# Patient Record
Sex: Female | Born: 1940 | Race: Black or African American | Hispanic: No | Marital: Married | Smoking: Former smoker
Health system: Southern US, Community
[De-identification: ages and names within clinical notes are randomized; demographics above are authoritative.]

## PROBLEM LIST (undated history)

## (undated) DIAGNOSIS — I10 Essential (primary) hypertension: Secondary | ICD-10-CM

## (undated) DIAGNOSIS — M109 Gout, unspecified: Secondary | ICD-10-CM

## (undated) HISTORY — PX: ABDOMINAL HYSTERECTOMY: SHX81

---

## 2004-04-23 ENCOUNTER — Ambulatory Visit: Payer: Self-pay | Admitting: Family Medicine

## 2005-10-22 ENCOUNTER — Ambulatory Visit: Payer: Self-pay | Admitting: *Deleted

## 2006-11-12 ENCOUNTER — Ambulatory Visit: Payer: Self-pay | Admitting: Family Medicine

## 2006-11-13 ENCOUNTER — Ambulatory Visit: Payer: Self-pay | Admitting: Family Medicine

## 2006-12-16 ENCOUNTER — Ambulatory Visit: Payer: Self-pay | Admitting: Urology

## 2007-09-23 ENCOUNTER — Ambulatory Visit: Payer: Self-pay | Admitting: Family Medicine

## 2007-12-28 ENCOUNTER — Ambulatory Visit: Payer: Self-pay | Admitting: Family Medicine

## 2008-10-11 ENCOUNTER — Ambulatory Visit: Payer: Self-pay | Admitting: Urology

## 2009-02-01 ENCOUNTER — Ambulatory Visit: Payer: Self-pay | Admitting: Family Medicine

## 2010-04-05 ENCOUNTER — Ambulatory Visit: Payer: Self-pay | Admitting: Family Medicine

## 2010-06-11 ENCOUNTER — Ambulatory Visit: Payer: Self-pay | Admitting: Family Medicine

## 2011-05-30 ENCOUNTER — Ambulatory Visit: Payer: Self-pay | Admitting: Family Medicine

## 2012-07-16 ENCOUNTER — Ambulatory Visit: Payer: Self-pay | Admitting: Family Medicine

## 2013-06-24 ENCOUNTER — Ambulatory Visit: Payer: Self-pay | Admitting: Unknown Physician Specialty

## 2013-06-25 LAB — PATHOLOGY REPORT

## 2013-08-09 ENCOUNTER — Ambulatory Visit: Payer: Self-pay | Admitting: Family Medicine

## 2013-08-12 ENCOUNTER — Ambulatory Visit: Payer: Self-pay | Admitting: Family Medicine

## 2013-12-08 ENCOUNTER — Emergency Department: Payer: Self-pay | Admitting: Emergency Medicine

## 2014-05-31 ENCOUNTER — Ambulatory Visit
Admit: 2014-05-31 | Disposition: A | Discharge status: Home / Self Care | Payer: Self-pay | Attending: Urology | Admitting: Urology

## 2014-06-23 ENCOUNTER — Ambulatory Visit
Admit: 2014-06-23 | Disposition: A | Discharge status: Home / Self Care | Payer: Self-pay | Attending: Urology | Admitting: Urology

## 2016-03-31 ENCOUNTER — Encounter: Payer: Self-pay | Admitting: Emergency Medicine

## 2016-03-31 ENCOUNTER — Emergency Department
Admission: EM | Admit: 2016-03-31 | Discharge: 2016-03-31 | Disposition: A | Discharge status: Home / Self Care | Payer: Medicare Other | Attending: Emergency Medicine | Admitting: Emergency Medicine

## 2016-03-31 ENCOUNTER — Emergency Department: Payer: Medicare Other

## 2016-03-31 DIAGNOSIS — Z87891 Personal history of nicotine dependence: Secondary | ICD-10-CM | POA: Diagnosis not present

## 2016-03-31 DIAGNOSIS — R05 Cough: Secondary | ICD-10-CM | POA: Diagnosis present

## 2016-03-31 DIAGNOSIS — I10 Essential (primary) hypertension: Secondary | ICD-10-CM | POA: Insufficient documentation

## 2016-03-31 DIAGNOSIS — J40 Bronchitis, not specified as acute or chronic: Secondary | ICD-10-CM | POA: Diagnosis not present

## 2016-03-31 HISTORY — DX: Essential (primary) hypertension: I10

## 2016-03-31 LAB — CBC
HEMATOCRIT: 40.4 % (ref 35.0–47.0)
Hemoglobin: 13.7 g/dL (ref 12.0–16.0)
MCH: 30.4 pg (ref 26.0–34.0)
MCHC: 33.8 g/dL (ref 32.0–36.0)
MCV: 90.1 fL (ref 80.0–100.0)
Platelets: 200 10*3/uL (ref 150–440)
RBC: 4.49 MIL/uL (ref 3.80–5.20)
RDW: 14.8 % — AB (ref 11.5–14.5)
WBC: 5.3 10*3/uL (ref 3.6–11.0)

## 2016-03-31 LAB — COMPREHENSIVE METABOLIC PANEL
ALBUMIN: 3.6 g/dL (ref 3.5–5.0)
ALK PHOS: 69 U/L (ref 38–126)
ALT: 27 U/L (ref 14–54)
AST: 28 U/L (ref 15–41)
Anion gap: 9 (ref 5–15)
BILIRUBIN TOTAL: 0.8 mg/dL (ref 0.3–1.2)
BUN: 13 mg/dL (ref 6–20)
CO2: 30 mmol/L (ref 22–32)
Calcium: 9.2 mg/dL (ref 8.9–10.3)
Chloride: 98 mmol/L — ABNORMAL LOW (ref 101–111)
Creatinine, Ser: 1.02 mg/dL — ABNORMAL HIGH (ref 0.44–1.00)
GFR calc Af Amer: >60 mL/min (ref >60)
GFR calc non Af Amer: 53 mL/min — ABNORMAL LOW (ref >60)
GLUCOSE: 114 mg/dL — AB (ref 65–99)
POTASSIUM: 3.1 mmol/L — AB (ref 3.5–5.1)
Sodium: 137 mmol/L (ref 135–145)
TOTAL PROTEIN: 7.7 g/dL (ref 6.5–8.1)

## 2016-03-31 LAB — TROPONIN I: Troponin I: <0.03 ng/mL (ref <0.03)

## 2016-03-31 MED ORDER — AZITHROMYCIN 500 MG PO TABS
500.0000 mg | ORAL_TABLET | Freq: Once | ORAL | Status: AC
Start: 2016-03-31 — End: 2016-03-31
  Administered 2016-03-31: 500 mg via ORAL
  Filled 2016-03-31: qty 1

## 2016-03-31 MED ORDER — PREDNISONE 20 MG PO TABS
60.0000 mg | ORAL_TABLET | Freq: Once | ORAL | Status: AC
  Administered 2016-03-31: 60 mg via ORAL
  Filled 2016-03-31: qty 3

## 2016-03-31 MED ORDER — PREDNISONE 50 MG PO TABS: ORAL_TABLET | ORAL | 0 refills | Status: DC

## 2016-03-31 MED ORDER — IPRATROPIUM-ALBUTEROL 0.5-2.5 (3) MG/3ML IN SOLN
9.0000 mL | Freq: Once | RESPIRATORY_TRACT | Status: AC
  Administered 2016-03-31: 9 mL via RESPIRATORY_TRACT
  Filled 2016-03-31: qty 9

## 2016-03-31 MED ORDER — AZITHROMYCIN 250 MG PO TABS: ORAL_TABLET | ORAL | 0 refills | Status: AC

## 2016-03-31 MED ORDER — ALBUTEROL SULFATE HFA 108 (90 BASE) MCG/ACT IN AERS: 2.0000 | INHALATION_SPRAY | Freq: Four times a day (QID) | RESPIRATORY_TRACT | 0 refills | Status: DC | PRN

## 2016-03-31 NOTE — ED Triage Notes (Signed)
Pt reports productive cough since Wednesday. C/o of yellow or blood tinged sputum. C/o chest pain made worse with cough. Denies fever or body aches.

## 2016-03-31 NOTE — ED Provider Notes (Signed)
[facility] Emergency Department Provider Note  ____________________________________________   First MD Initiated Contact with Patient 03/31/16 [phone]     (approximate)  I have reviewed the triage vital signs and the nursing notes.   HISTORY  Chief Complaint Chest Pain and Cough   HPI Erin Goodman is a 76 y.o. female with a history of hypertension who is presenting to the emergency department today with 3 days of cough and wheezing. She says that she also has some mild chest pain but only when she coughs. Says the chest pain is midsternal. Denies any nausea or vomiting. Denies any fever or body aches. Has a remote smoking history. Has not wheezed in the past. No history of COPD.   Past Medical History:  Diagnosis Date  . Hypertension     There are no active problems to display for this patient.   Past Surgical History:  Procedure Laterality Date  . ABDOMINAL HYSTERECTOMY      Prior to Admission medications   Not on File    Allergies Shellfish allergy  No family history on file.  Social History Social History  Substance Use Topics  . Smoking status: Former Games developermoker  . Smokeless tobacco: Never Used  . Alcohol use Yes     Comment: ocassionaly     Review of Systems Constitutional: No fever/chills Eyes: No visual changes. ENT: No sore throat. Cardiovascular: as above Respiratory: as above. Gastrointestinal: No abdominal pain.  No nausea, no vomiting.  No diarrhea.  No constipation. Genitourinary: Negative for dysuria. Musculoskeletal: Negative for back pain. Skin: Negative for rash. Neurological: Negative for headaches, focal weakness or numbness.  10-point ROS otherwise negative.  ____________________________________________   PHYSICAL EXAM:  VITAL SIGNS: ED Triage Vitals  Enc Vitals Group     BP 03/31/16 0742 (!) 155/66     Pulse Rate 03/31/16 0742 80     Resp 03/31/16 0742 20     Temp 03/31/16 0742 98.5 F (36.9 C)    Temp Source 03/31/16 0742 Oral     SpO2 03/31/16 0742 95 %     Weight 03/31/16 0744 250 lb (113.4 kg)     Height 03/31/16 0744 5\' 5"  (1.651 m)     Head Circumference --      Peak Flow --      Pain Score --      Pain Loc --      Pain Edu? --      Excl. in GC? --     Constitutional: Alert and oriented. Well appearing and in no acute distress. Eyes: Conjunctivae are normal. PERRL. EOMI. Head: Atraumatic. Nose: No congestion/rhinnorhea. Mouth/Throat: Mucous membranes are moist.   Neck: No stridor.   Cardiovascular: Normal rate, regular rhythm. Grossly normal heart sounds.  Respiratory: Normal respiratory effort.  No retractions. Mildly prolonged expiratory phase with minimal wheezing throughout both fields. Gastrointestinal: Soft and nontender. No distention. Musculoskeletal: Moderate edema to the bilateral lower extremities but the patient says that this is chronic and unchanged. Neurologic:  Normal speech and language. No gross focal neurologic deficits are appreciated.  Skin:  Skin is warm, dry and intact. No rash noted. Psychiatric: Mood and affect are normal. Speech and behavior are normal.  ____________________________________________   LABS (all labs ordered are listed, but only abnormal results are displayed)  Labs Reviewed  COMPREHENSIVE METABOLIC PANEL - Abnormal; Notable for the following:       Result Value   Potassium 3.1 (*)    Chloride 98 (*)  Glucose, Bld 114 (*)    Creatinine, Ser 1.02 (*)    GFR calc non Af Amer 53 (*)    All other components within normal limits  CBC - Abnormal; Notable for the following:    RDW 14.8 (*)    All other components within normal limits  TROPONIN I   ____________________________________________  EKG  ED ECG REPORT I, Marcellous Snarski,  Teena Irani, the attending physician, personally viewed and interpreted this ECG.   Date: 03/31/2016  EKG Time: 0757  Rate: 79  Rhythm: normal sinus rhythm  Axis: Normal  Intervals:  Normal   ST&T Change: No ST segment elevation or depression. No abnormal T-wave inversion.  ____________________________________________  RADIOLOGY  DG Chest 2 View (Final result)  Result time 03/31/16 08:50:43  Final result by Princella Pellegrini, MD (03/31/16 08:50:43)           Narrative:   CLINICAL DATA: 76 year old female with productive cough for 4 days and chest pain. Initial encounter.  EXAM: CHEST 2 VIEW  COMPARISON: 12/08/2013 and earlier.  FINDINGS: Stable lung volumes. Stable tortuosity of the thoracic aorta. Mild Calcified aortic atherosclerosis. Other mediastinal contours are within normal limits. Visualized tracheal air column is within normal limits. No pneumothorax, pulmonary edema, pleural effusion or confluent pulmonary opacity. No acute osseous abnormality identified.  IMPRESSION: No acute cardiopulmonary abnormality.  Calcified aortic atherosclerosis.   Electronically Signed By: Odessa Fleming M.D. On: 03/31/2016 08:50            ____________________________________________   PROCEDURES  Procedure(s) performed:   Procedures  Critical Care performed:   ____________________________________________   INITIAL IMPRESSION / ASSESSMENT AND PLAN / ED COURSE  Pertinent labs & imaging results that were available during my care of the patient were reviewed by me and considered in my medical decision making (see chart for details).  ----------------------------------------- 12:23 PM on 03/31/2016 -----------------------------------------  Reassessed the patient's breathing and she is now clear to auscultation throughout. She says that she feels subjectively better as well. Will be discharged with prednisone as well as azithromycin and an inhaler. She is understanding of the plan and willing to comply. Will be following up with [facility] clinic.      ____________________________________________   FINAL CLINICAL IMPRESSION(S) / ED  DIAGNOSES  Bronchitis.     NEW MEDICATIONS STARTED DURING THIS VISIT:  New Prescriptions   No medications on file     Note:  This document was prepared using Dragon voice recognition software and may include unintentional dictation errors.    Myrna Blazer, MD 03/31/16 1224

## 2016-07-24 ENCOUNTER — Other Ambulatory Visit: Payer: Self-pay | Admitting: Nurse Practitioner

## 2016-07-24 DIAGNOSIS — Z1231 Encounter for screening mammogram for malignant neoplasm of breast: Secondary | ICD-10-CM

## 2016-07-31 ENCOUNTER — Other Ambulatory Visit: Payer: Self-pay

## 2016-08-23 ENCOUNTER — Encounter: Payer: Self-pay | Admitting: Emergency Medicine

## 2016-08-23 ENCOUNTER — Emergency Department: Payer: Medicare Other

## 2016-08-23 ENCOUNTER — Emergency Department
Admission: EM | Admit: 2016-08-23 | Discharge: 2016-08-23 | Disposition: A | Discharge status: Home / Self Care | Payer: Medicare Other | Attending: Emergency Medicine | Admitting: Emergency Medicine

## 2016-08-23 DIAGNOSIS — Z7951 Long term (current) use of inhaled steroids: Secondary | ICD-10-CM | POA: Insufficient documentation

## 2016-08-23 DIAGNOSIS — Z79899 Other long term (current) drug therapy: Secondary | ICD-10-CM | POA: Insufficient documentation

## 2016-08-23 DIAGNOSIS — M7989 Other specified soft tissue disorders: Secondary | ICD-10-CM

## 2016-08-23 DIAGNOSIS — I10 Essential (primary) hypertension: Secondary | ICD-10-CM | POA: Diagnosis not present

## 2016-08-23 DIAGNOSIS — R2242 Localized swelling, mass and lump, left lower limb: Secondary | ICD-10-CM | POA: Diagnosis present

## 2016-08-23 DIAGNOSIS — Z87891 Personal history of nicotine dependence: Secondary | ICD-10-CM | POA: Insufficient documentation

## 2016-08-23 DIAGNOSIS — R609 Edema, unspecified: Secondary | ICD-10-CM

## 2016-08-23 LAB — CBC WITH DIFFERENTIAL/PLATELET
Basophils Absolute: 0 10*3/uL (ref 0–0.1)
Basophils Relative: 1 %
EOS ABS: 0.2 10*3/uL (ref 0–0.7)
Eosinophils Relative: 3 %
HEMATOCRIT: 42.5 % (ref 35.0–47.0)
HEMOGLOBIN: 14.6 g/dL (ref 12.0–16.0)
LYMPHS ABS: 2.9 10*3/uL (ref 1.0–3.6)
LYMPHS PCT: 52 %
MCH: 30.7 pg (ref 26.0–34.0)
MCHC: 34.3 g/dL (ref 32.0–36.0)
MCV: 89.5 fL (ref 80.0–100.0)
MONOS PCT: 10 %
Monocytes Absolute: 0.5 10*3/uL (ref 0.2–0.9)
NEUTROS PCT: 34 %
Neutro Abs: 1.9 10*3/uL (ref 1.4–6.5)
Platelets: 215 10*3/uL (ref 150–440)
RBC: 4.75 MIL/uL (ref 3.80–5.20)
RDW: 15 % — ABNORMAL HIGH (ref 11.5–14.5)
WBC: 5.6 10*3/uL (ref 3.6–11.0)

## 2016-08-23 LAB — COMPREHENSIVE METABOLIC PANEL
ALK PHOS: 67 U/L (ref 38–126)
ALT: 21 U/L (ref 14–54)
ANION GAP: 9 (ref 5–15)
AST: 25 U/L (ref 15–41)
Albumin: 3.9 g/dL (ref 3.5–5.0)
BILIRUBIN TOTAL: 0.7 mg/dL (ref 0.3–1.2)
BUN: 14 mg/dL (ref 6–20)
CALCIUM: 9.5 mg/dL (ref 8.9–10.3)
CO2: 29 mmol/L (ref 22–32)
CREATININE: 1.08 mg/dL — AB (ref 0.44–1.00)
Chloride: 99 mmol/L — ABNORMAL LOW (ref 101–111)
GFR, EST AFRICAN AMERICAN: 57 mL/min — AB (ref >60)
GFR, EST NON AFRICAN AMERICAN: 49 mL/min — AB (ref >60)
Glucose, Bld: 96 mg/dL (ref 65–99)
Potassium: 3.5 mmol/L (ref 3.5–5.1)
SODIUM: 137 mmol/L (ref 135–145)
TOTAL PROTEIN: 7.8 g/dL (ref 6.5–8.1)

## 2016-08-23 LAB — BRAIN NATRIURETIC PEPTIDE: B Natriuretic Peptide: 11 pg/mL (ref 0.0–100.0)

## 2016-08-23 NOTE — ED Notes (Signed)
Pt family denies needs at this time while patient in [place].

## 2016-08-23 NOTE — ED Notes (Signed)
Patient transported to Ultrasound 

## 2016-08-23 NOTE — ED Provider Notes (Signed)
[facility] Emergency Department Provider Note   ____________________________________________   I have reviewed the triage vital signs and the nursing notes.   HISTORY  Chief Complaint Leg Pain   History limited by: Not Limited   HPI Erin Goodman is a 76 y.o. female who presents to the emergency department today because of concerns for leg swelling. She states her left leg is worse. It has been swollen for months. The swelling will sometimes come and go in terms of its severity. The patient states she saw her primary care doctor but did not get an answer for why her legs are swollen. The patient does have associated pain. Is located in the posterior thigh. She has had some right leg swelling but it has not been as severe. The patient's concern that she might have a blood clot in that leg.   Past Medical History:  Diagnosis Date  . Hypertension     There are no active problems to display for this patient.   Past Surgical History:  Procedure Laterality Date  . ABDOMINAL HYSTERECTOMY      Prior to Admission medications   Medication Sig Start Date End Date Taking? Authorizing Provider  albuterol (PROVENTIL HFA;VENTOLIN HFA) 108 (90 Base) MCG/ACT inhaler Inhale 2 puffs into the lungs every 6 (six) hours as needed. 03/31/16   Myrna Blazer, MD  predniSONE (DELTASONE) 50 MG tablet 1 tab PO daily 03/31/16   Pershing Proud Myra Rude, MD    Allergies Penicillins and Shellfish allergy  No family history on file.  Social History Social History  Substance Use Topics  . Smoking status: Former Games developer  . Smokeless tobacco: Never Used  . Alcohol use Yes     Comment: ocassionaly     Review of Systems Constitutional: No fever/chills Eyes: No visual changes. ENT: No sore throat. Cardiovascular: Denies chest pain. Respiratory: Denies shortness of breath. Gastrointestinal: No abdominal pain.  No nausea, no vomiting.  No diarrhea.   Genitourinary:  Negative for dysuria. Musculoskeletal: Positive for leg swelling. Skin: Negative for rash. Neurological: Negative for headaches, focal weakness or numbness.  ____________________________________________   PHYSICAL EXAM:  VITAL SIGNS: ED Triage Vitals [08/23/16 1003]  Enc Vitals Group     BP 140/81     Pulse Rate 89     Resp 16     Temp 99.1 F (37.3 C)     Temp Source Oral     SpO2 97 %     Weight 250 lb (113.4 kg)     Height      Head Circumference      Peak Flow      Pain Score 6   Constitutional: Alert and oriented. Well appearing and in no distress. Eyes: Conjunctivae are normal.  ENT   Head: Normocephalic and atraumatic.   Nose: No congestion/rhinnorhea.   Mouth/Throat: Mucous membranes are moist.   Neck: No stridor. Hematological/Lymphatic/Immunilogical: No cervical lymphadenopathy. Cardiovascular: Normal rate, regular rhythm.  Systolic murmur.  Respiratory: Normal respiratory effort without tachypnea nor retractions. Breath sounds are clear and equal bilaterally. No wheezes/rales/rhonchi. Gastrointestinal: Soft and non tender. No rebound. No guarding.  Genitourinary: Deferred Musculoskeletal: Normal range of motion in all extremities. Bilateral lower extremity swelling, left greater than right. Neurologic:  Normal speech and language. No gross focal neurologic deficits are appreciated.  Skin:  Skin is warm, dry and intact. No rash noted. Psychiatric: Mood and affect are normal. Speech and behavior are normal. Patient exhibits appropriate insight and judgment.  ____________________________________________  LABS (pertinent positives/negatives)  Labs Reviewed  CBC WITH DIFFERENTIAL/PLATELET - Abnormal; Notable for the following:       Result Value   RDW 15.0 (*)    All other components within normal limits  COMPREHENSIVE METABOLIC PANEL - Abnormal; Notable for the following:    Chloride 99 (*)    Creatinine, Ser 1.08 (*)    GFR calc non Af  Amer 49 (*)    GFR calc Af Amer 57 (*)    All other components within normal limits  BRAIN NATRIURETIC PEPTIDE     ____________________________________________   EKG  None  ____________________________________________    RADIOLOGY  US LE IMPRESSION:  No evidence of DVT within the left lower extremity.    ____________________________________________   PROCEDURES  Procedures  ____________________________________________   INITIAL IMPRESSION / ASSESSMENT AND PLAN / ED COURSE  Pertinent labs & imaging results that were available during my care of the patient were reviewed by me and considered in my medical decision making (see chart for details).  Patient presented to the emergency department today because of concerns for lower extremity swelling. Workup here without concerning findings. At this point I think venous insufficiency.  ____________________________________________   FINAL CLINICAL IMPRESSION(S) / ED DIAGNOSES  Final diagnoses:  Swelling  Swelling of lower extremity     Note: This dictation was prepared with Dragon dictation. Any transcriptional errors that result from this process are unintentional     Phineas SemenGoodman, Tymber Stallings, MD 08/23/16 1418

## 2016-08-23 NOTE — ED Triage Notes (Signed)
Pt to ED via POV, pt states that she has had swelling and pain in her left leg for the past month. Pt states that she has seen her PCP but has not had an ultrasound done. Swelling noted in bilateral lower extremities, left greater than right. Pt does not appear to be in any distress at this time.

## 2016-08-23 NOTE — Discharge Instructions (Signed)
Please seek medical attention for any high fevers, chest pain, shortness of breath, change in behavior, persistent vomiting, bloody stool or any other new or concerning symptoms.  

## 2016-08-26 ENCOUNTER — Ambulatory Visit: Payer: Self-pay | Admitting: Urology

## 2016-12-19 ENCOUNTER — Ambulatory Visit
Admission: RE | Admit: 2016-12-19 | Discharge: 2016-12-19 | Disposition: A | Discharge status: Home / Self Care | Payer: Medicare Other | Source: Ambulatory Visit | Attending: Nurse Practitioner | Admitting: Nurse Practitioner

## 2016-12-19 DIAGNOSIS — Z1231 Encounter for screening mammogram for malignant neoplasm of breast: Secondary | ICD-10-CM | POA: Insufficient documentation

## 2017-03-30 DIAGNOSIS — M109 Gout, unspecified: Secondary | ICD-10-CM | POA: Diagnosis not present

## 2017-03-30 DIAGNOSIS — M79674 Pain in right toe(s): Secondary | ICD-10-CM | POA: Diagnosis present

## 2017-03-30 DIAGNOSIS — Z79899 Other long term (current) drug therapy: Secondary | ICD-10-CM | POA: Insufficient documentation

## 2017-03-30 DIAGNOSIS — Z87891 Personal history of nicotine dependence: Secondary | ICD-10-CM | POA: Diagnosis not present

## 2017-03-30 DIAGNOSIS — I10 Essential (primary) hypertension: Secondary | ICD-10-CM | POA: Insufficient documentation

## 2017-03-31 ENCOUNTER — Encounter: Payer: Self-pay | Admitting: Emergency Medicine

## 2017-03-31 ENCOUNTER — Emergency Department
Admission: EM | Admit: 2017-03-31 | Discharge: 2017-03-31 | Disposition: A | Discharge status: Home / Self Care | Payer: Medicare Other | Attending: Emergency Medicine | Admitting: Emergency Medicine

## 2017-03-31 ENCOUNTER — Telehealth: Payer: Self-pay | Admitting: Obstetrics & Gynecology

## 2017-03-31 ENCOUNTER — Emergency Department: Payer: Medicare Other

## 2017-03-31 ENCOUNTER — Other Ambulatory Visit: Payer: Self-pay

## 2017-03-31 DIAGNOSIS — M109 Gout, unspecified: Secondary | ICD-10-CM | POA: Diagnosis not present

## 2017-03-31 LAB — CBC WITH DIFFERENTIAL/PLATELET
BASOS PCT: 1 %
Basophils Absolute: 0 10*3/uL (ref 0–0.1)
Eosinophils Absolute: 0.2 10*3/uL (ref 0–0.7)
Eosinophils Relative: 3 %
HCT: 41.5 % (ref 35.0–47.0)
Hemoglobin: 14.2 g/dL (ref 12.0–16.0)
LYMPHS ABS: 2.8 10*3/uL (ref 1.0–3.6)
Lymphocytes Relative: 39 %
MCH: 30.5 pg (ref 26.0–34.0)
MCHC: 34.2 g/dL (ref 32.0–36.0)
MCV: 89.2 fL (ref 80.0–100.0)
MONO ABS: 0.7 10*3/uL (ref 0.2–0.9)
MONOS PCT: 9 %
NEUTROS ABS: 3.5 10*3/uL (ref 1.4–6.5)
Neutrophils Relative %: 48 %
Platelets: 217 10*3/uL (ref 150–440)
RBC: 4.65 MIL/uL (ref 3.80–5.20)
RDW: 15.2 % — AB (ref 11.5–14.5)
WBC: 7.2 10*3/uL (ref 3.6–11.0)

## 2017-03-31 LAB — BASIC METABOLIC PANEL
Anion gap: 13 (ref 5–15)
BUN: 17 mg/dL (ref 6–20)
CALCIUM: 9.1 mg/dL (ref 8.9–10.3)
CO2: 23 mmol/L (ref 22–32)
CREATININE: 0.97 mg/dL (ref 0.44–1.00)
Chloride: 100 mmol/L — ABNORMAL LOW (ref 101–111)
GFR calc non Af Amer: 56 mL/min — ABNORMAL LOW (ref >60)
GLUCOSE: 127 mg/dL — AB (ref 65–99)
Potassium: 3.5 mmol/L (ref 3.5–5.1)
Sodium: 136 mmol/L (ref 135–145)

## 2017-03-31 LAB — URIC ACID: Uric Acid, Serum: 7.4 mg/dL — ABNORMAL HIGH (ref 2.3–6.6)

## 2017-03-31 MED ORDER — OXYCODONE-ACETAMINOPHEN 5-325 MG PO TABS
1.0000 | ORAL_TABLET | Freq: Once | ORAL | Status: AC
  Administered 2017-03-31: 1 via ORAL
  Filled 2017-03-31: qty 1

## 2017-03-31 MED ORDER — PREDNISONE 20 MG PO TABS: 40.0000 mg | ORAL_TABLET | Freq: Every day | ORAL | 0 refills | Status: DC

## 2017-03-31 MED ORDER — PREDNISONE 20 MG PO TABS
60.0000 mg | ORAL_TABLET | Freq: Once | ORAL | Status: AC
  Administered 2017-03-31: 60 mg via ORAL
  Filled 2017-03-31: qty 3

## 2017-03-31 MED ORDER — OXYCODONE-ACETAMINOPHEN 5-325 MG PO TABS: 2.0000 | ORAL_TABLET | ORAL | 0 refills | Status: DC | PRN

## 2017-03-31 NOTE — ED Provider Notes (Signed)
[facility] Emergency Department Provider Note   ____________________________________________   First MD Initiated Contact with Patient 03/31/17 [phone]     (approximate)  I have reviewed the triage vital signs and the nursing notes.   HISTORY  Chief Complaint Toe Pain    HPI Erin Goodman is a 77 y.o. female who comes into the hospital today because she thinks she may be having a gout flare.  The patient reports that she has had some burning and throbbing in her right great toe that started on Saturday.  She has been taking Motrin for pain but it was not helping.  She was also drinking a lot of water.  The patient states that she has never had symptoms like this in the past.  She states that when she eats a lot of not she will sometimes have some tingling but usually when she stops it goes away.  The patient reports though that this pain just kept getting worse and worse.  She was at the store walking around and when she got home she noticed that her great toe was red and swollen.  The patient states that tonight she just could not sleep due to the pain so she came in for evaluation.  The patient rates her pain an 8 out of 10 in intensity.  She denies any fevers chills nausea or vomiting.  She is here for evaluation.   Past Medical History:  Diagnosis Date  . Hypertension     There are no active problems to display for this patient.   Past Surgical History:  Procedure Laterality Date  . ABDOMINAL HYSTERECTOMY      Prior to Admission medications   Medication Sig Start Date End Date Taking? Authorizing Provider  triamterene-hydrochlorothiazide (MAXZIDE-25) 37.5-25 MG tablet Take 1 tablet by mouth daily.   Yes [provider]  oxyCODONE-acetaminophen (PERCOCET/ROXICET) 5-325 MG tablet Take 2 tablets by mouth every 4 (four) hours as needed for severe pain. 03/31/17   Rebecka Apley, MD  predniSONE (DELTASONE) 20 MG tablet Take 2 tablets (40 mg  total) by mouth daily. 03/31/17   Rebecka Apley, MD    Allergies Penicillins and Shellfish allergy  History reviewed. No pertinent family history.  Social History Social History   Tobacco Use  . Smoking status: Former Games developer  . Smokeless tobacco: Never Used  Substance Use Topics  . Alcohol use: Yes    Comment: ocassionaly   . Drug use: No    Review of Systems  Constitutional: No fever/chills Eyes: No visual changes. ENT: No sore throat. Cardiovascular: Denies chest pain. Respiratory: Denies shortness of breath. Gastrointestinal: No abdominal pain.  No nausea, no vomiting.  No diarrhea.  No constipation. Genitourinary: Negative for dysuria. Musculoskeletal: Right big toe pain Skin: Negative for rash. Neurological: Negative for headaches, focal weakness or numbness.   ____________________________________________   PHYSICAL EXAM:  VITAL SIGNS: ED Triage Vitals  Enc Vitals Group     BP 03/31/17 0003 135/82     Pulse Rate 03/31/17 0003 86     Resp 03/31/17 0003 18     Temp 03/31/17 0003 99.1 F (37.3 C)     Temp Source 03/31/17 0003 Oral     SpO2 03/31/17 0003 97 %     Weight 03/31/17 0003 257 lb (116.6 kg)     Height 03/31/17 0003 5\' 4"  (1.626 m)     Head Circumference --      Peak Flow --  Pain Score 03/31/17 0024 9     Pain Loc --      Pain Edu? --      Excl. in GC? --     Constitutional: Alert and oriented. Well appearing and in moderate distress. Eyes: Conjunctivae are normal. PERRL. EOMI. Head: Atraumatic. Nose: No congestion/rhinnorhea. Mouth/Throat: Mucous membranes are moist.  Oropharynx non-erythematous. Cardiovascular: Normal rate, regular rhythm. Grossly normal heart sounds.  Good peripheral circulation. Respiratory: Normal respiratory effort.  No retractions. Lungs CTAB. Gastrointestinal: Soft and nontender. No distention. Positive bowel sounds Musculoskeletal: redness to right great toe, with mild swelling and tenderness to  palpation Neurologic:  Normal speech and language. No gross focal neurologic deficits are appreciated. No gait instability. Skin:  Skin is warm, dry and intact.  Psychiatric: Mood and affect are normal. Speech and behavior are normal.  ____________________________________________   LABS (all labs ordered are listed, but only abnormal results are displayed)  Labs Reviewed  CBC WITH DIFFERENTIAL/PLATELET - Abnormal; Notable for the following components:      Result Value   RDW 15.2 (*)    All other components within normal limits  BASIC METABOLIC PANEL - Abnormal; Notable for the following components:   Chloride 100 (*)    Glucose, Bld 127 (*)    GFR calc non Af Amer 56 (*)    All other components within normal limits  URIC ACID - Abnormal; Notable for the following components:   Uric Acid, Serum 7.4 (*)    All other components within normal limits   ____________________________________________  EKG  none ____________________________________________  RADIOLOGY  ED MD interpretation:   Right toe x-ray, no fracture, soft tissue swelling.  Official radiology report(s): Dg Toe Great Right  Result Date: 03/31/2017 CLINICAL DATA:  Swelling and pain in the great toe EXAM: RIGHT GREAT TOE COMPARISON:  None. FINDINGS: Soft tissue swelling at the first MTP joint. Joint spaces relatively preserved. No fracture. Erosion at the head of the first metatarsal. IMPRESSION: 1. No acute fracture or malalignment 2. Soft tissue swelling at the first MTP joint. Well-defined erosions at the head of the first metatarsal, could be seen with gouty disease. Electronically Signed   By: Jasmine PangKim  Fujinaga M.D.   On: 03/31/2017 02:01    ____________________________________________   PROCEDURES  Procedure(s) performed: None  Procedures  Critical Care performed: No  ____________________________________________   INITIAL IMPRESSION / ASSESSMENT AND PLAN / ED COURSE  As part of my medical decision  making, I reviewed the following data within the electronic MEDICAL RECORD NUMBER Notes from prior ED visits and [place] Controlled Substance Database   This is a 77 year old female who comes into the hospital today with possible gout.  Differential diagnosis includes fracture, soft tissue injury, gout  The patient had some blood work evaluated which was unremarkable.  The patient's x-ray did show some erosions at the head of the first metatarsal which could be seen with gouty disease.  She also has some soft tissue swelling with no fracture.  The patient will be given some prednisone and Percocet.  Her pain is improved.  I will reassess the patient when she is received some medications.     Was sleeping when I went to reassess her.  She reports that she has some improvement in her pain.  I will discharge the patient to home to have her follow-up with her primary care physician. 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    Rebecka Apley, MD 03/31/17 [phone]

## 2017-03-31 NOTE — Discharge Instructions (Signed)
Please follow up with your primary care physician for further evaluation of your gout

## 2017-03-31 NOTE — Telephone Encounter (Signed)
[facility] referring for Vaginal Mass. Called and left voicemail for patient to call back to be schedule.

## 2017-03-31 NOTE — ED Notes (Signed)
Patient with pain and swelling to right foot that started on Saturday. Patient reports that the pain and swelling have become worse. Patient denies any injury.

## 2017-03-31 NOTE — ED Notes (Signed)
Pt updated on delay, pt verbalizes understanding.  

## 2017-03-31 NOTE — ED Triage Notes (Addendum)
Pt reports swelling to joint of right great toe since Saturday; pt says pain is worse with ambulation; no history of gout but believes that's what she may have; pt says toe has been throbbing and she was unable to lay down to sleep; pt adds second digit is also painful;

## 2017-04-07 ENCOUNTER — Encounter: Payer: Self-pay | Admitting: Obstetrics and Gynecology

## 2017-04-07 ENCOUNTER — Other Ambulatory Visit: Payer: Self-pay

## 2017-04-07 ENCOUNTER — Ambulatory Visit (INDEPENDENT_AMBULATORY_CARE_PROVIDER_SITE_OTHER): Payer: Medicare Other | Admitting: Obstetrics and Gynecology

## 2017-04-07 VITALS — BP 120/74 | HR 92 | Ht 64.0 in | Wt 258.0 lb

## 2017-04-07 DIAGNOSIS — R1909 Other intra-abdominal and pelvic swelling, mass and lump: Secondary | ICD-10-CM

## 2017-04-07 NOTE — Progress Notes (Signed)
Patient ID: MARAM BENTLY, female   DOB: October 22, 1940, 77 y.o.   MRN: [id number]  Reason for Consult: Referral [facility] (Vaginal Mass))   Referred by Martie Round, NP  Subjective:     HPI:  Erin Goodman is a 77 y.o. female patient reports that 2-3 months ago she noticed a bulge on the side of her vagina.  The patient states that she has been putting it back in and it has gotten smaller.  She states that it was apple size and now is about a golf ball size.  She says that she mostly notices it when she squats.  She thinks that it may be a kidney stone because she has had blood in her urine on urinary analysis.  She denies gross hematuria.  Patient states that she is seeing urology for this issue.  The patient reports that she has not had any vaginal bleeding the patient reports that she does urinate 2-3 times at night she denies any stress incontinence she denies any urge incontinence she denies any constipation or diarrhea patient had a abdominal hysterectomy several years ago for fibroid uterus at the age of 11 she states that she really still has her ovaries the patient denies any history of sexually transmitted diseases she reports that her last Pap smear was 3 years ago and was normal she is sexually active she denies any dyspareunia.  She states that the bulge is not interfered with intercourse.  Past Medical History:  Diagnosis Date  . Hypertension    History reviewed. No pertinent family history. Past Surgical History:  Procedure Laterality Date  . ABDOMINAL HYSTERECTOMY      Short Social History:  Social History   Tobacco Use  . Smoking status: Former Games developer  . Smokeless tobacco: Never Used  Substance Use Topics  . Alcohol use: Yes    Comment: ocassionaly     Allergies  Allergen Reactions  . Penicillins   . Shellfish Allergy Itching    Current Outpatient Medications  Medication Sig Dispense Refill  . oxyCODONE-acetaminophen (PERCOCET/ROXICET)  5-325 MG tablet Take 2 tablets by mouth every 4 (four) hours as needed for severe pain. 12 tablet 0  . predniSONE (DELTASONE) 20 MG tablet Take 2 tablets (40 mg total) by mouth daily. 12 tablet 0  . triamterene-hydrochlorothiazide (MAXZIDE-25) 37.5-25 MG tablet Take 1 tablet by mouth daily.     No current facility-administered medications for this visit.     Review of Systems  Constitutional: Negative for chills, fatigue, fever and unexpected weight change.  HENT: Negative for trouble swallowing.  Eyes: Negative for loss of vision.  Respiratory: Negative for cough, shortness of breath and wheezing.  Cardiovascular: Negative for chest pain, leg swelling, palpitations and syncope.  GI: Negative for abdominal pain, blood in stool, diarrhea, nausea and vomiting.  GU: Negative for difficulty urinating, dysuria, frequency and hematuria.  Musculoskeletal: Negative for back pain, leg pain and joint pain.  Skin: Negative for rash.  Neurological: Negative for dizziness, headaches, light-headedness, numbness and seizures.  Psychiatric: Negative for behavioral problem, confusion, depressed mood and sleep disturbance.        Objective:  Objective   Vitals:   04/07/17 0906  BP: 120/74  Pulse: 92  Weight: 258 lb (117 kg)  Height: 5\' 4"  (1.626 m)   Body mass index is 44.29 kg/m.  Physical Exam  Constitutional: She is oriented to person, place, and time. She appears well-developed and well-nourished.  HENT:  Head: Normocephalic  and atraumatic.  Eyes: EOM are normal.  Cardiovascular: Normal rate, regular rhythm and normal heart sounds.  Pulmonary/Chest: Effort normal and breath sounds normal.  Abdominal: Hernia confirmed negative in the right inguinal area and confirmed negative in the left inguinal area.  Genitourinary: Vagina normal. Rectal exam shows external hemorrhoid. Rectal exam shows no internal hemorrhoid, no fissure, no mass and no tenderness. There is no rash, tenderness, lesion  or injury on the right labia. There is no rash, tenderness, lesion or injury on the left labia. Right adnexum displays no mass, no tenderness and no fullness. Left adnexum displays no fullness. No tenderness or bleeding in the vagina. No foreign body in the vagina. No signs of injury around the vagina. No vaginal discharge found.  Genitourinary Comments: Split speculum exam did not show prolapse or a bulge. The patient squatted on exam at the side of the table and no bulge was seen or palpated. Patient tried to point to the area not there was no bulge seen by myself. She told me " that's were it normally is, but I don't feel it now." She was pointing to an area on her right side just below her introitus. Rectal exam did show external hemorrhoids. This may of been what the patient was feeling, but I am uncertain.   Neurological: She is alert and oriented to person, place, and time.  Skin: Skin is warm and dry.  Psychiatric: She has a normal mood and affect. Her behavior is normal. Judgment and thought content normal.  Nursing note and vitals reviewed.       Assessment/Plan:     76yo with complaints of a vaginal bulge Nothing abnormal on exam, no prolapse of vaginal vault, anterior vagina or posterior vaginal wall with valsava. No abnormal findings. Follow up if bulge recurs. More than 20 minutes spent face to face with patient.  Natale Milchhristanna R Schuman MD [facility] 04/08/17 3:06 PM

## 2017-05-19 ENCOUNTER — Other Ambulatory Visit: Payer: Self-pay | Admitting: Nurse Practitioner

## 2017-05-19 DIAGNOSIS — Z13820 Encounter for screening for osteoporosis: Secondary | ICD-10-CM

## 2017-06-16 ENCOUNTER — Ambulatory Visit
Admission: RE | Admit: 2017-06-16 | Discharge: 2017-06-16 | Disposition: A | Discharge status: Home / Self Care | Payer: Medicare Other | Source: Ambulatory Visit | Attending: Nurse Practitioner | Admitting: Nurse Practitioner

## 2017-06-16 DIAGNOSIS — Z13820 Encounter for screening for osteoporosis: Secondary | ICD-10-CM | POA: Diagnosis not present

## 2017-06-16 DIAGNOSIS — Z780 Asymptomatic menopausal state: Secondary | ICD-10-CM | POA: Diagnosis not present

## 2017-08-18 ENCOUNTER — Other Ambulatory Visit (HOSPITAL_COMMUNITY): Payer: Self-pay | Admitting: Family Medicine

## 2017-08-18 DIAGNOSIS — N390 Urinary tract infection, site not specified: Secondary | ICD-10-CM

## 2017-08-18 DIAGNOSIS — R319 Hematuria, unspecified: Secondary | ICD-10-CM

## 2017-08-18 DIAGNOSIS — Z87442 Personal history of urinary calculi: Secondary | ICD-10-CM

## 2019-01-07 IMAGING — CR DG TOE GREAT 2+V*R*
1 series · 3 of 3 positions shown · non-contrast
Comparison: None.

CLINICAL DATA: Swelling and pain in the great toe

EXAM:
RIGHT GREAT TOE

[Series 1: dg toe great right · 0.14mm/px · 3 of 3 slices shown]
[im 1/3]
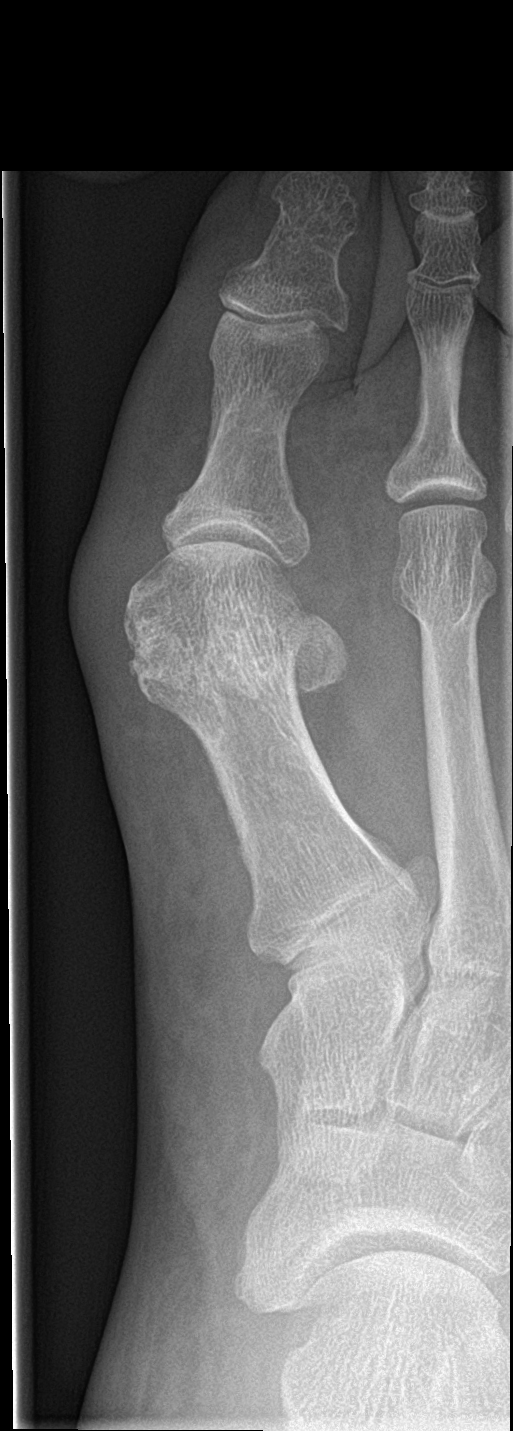
[im 2/3]
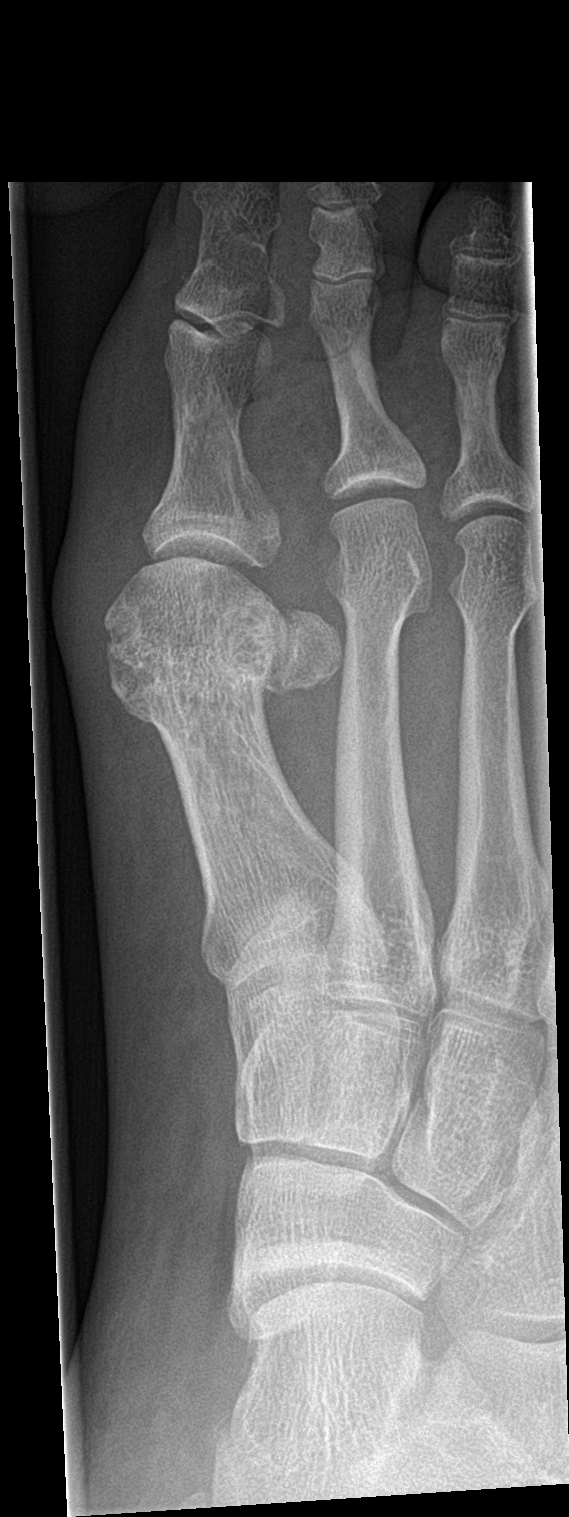
[im 3/3]
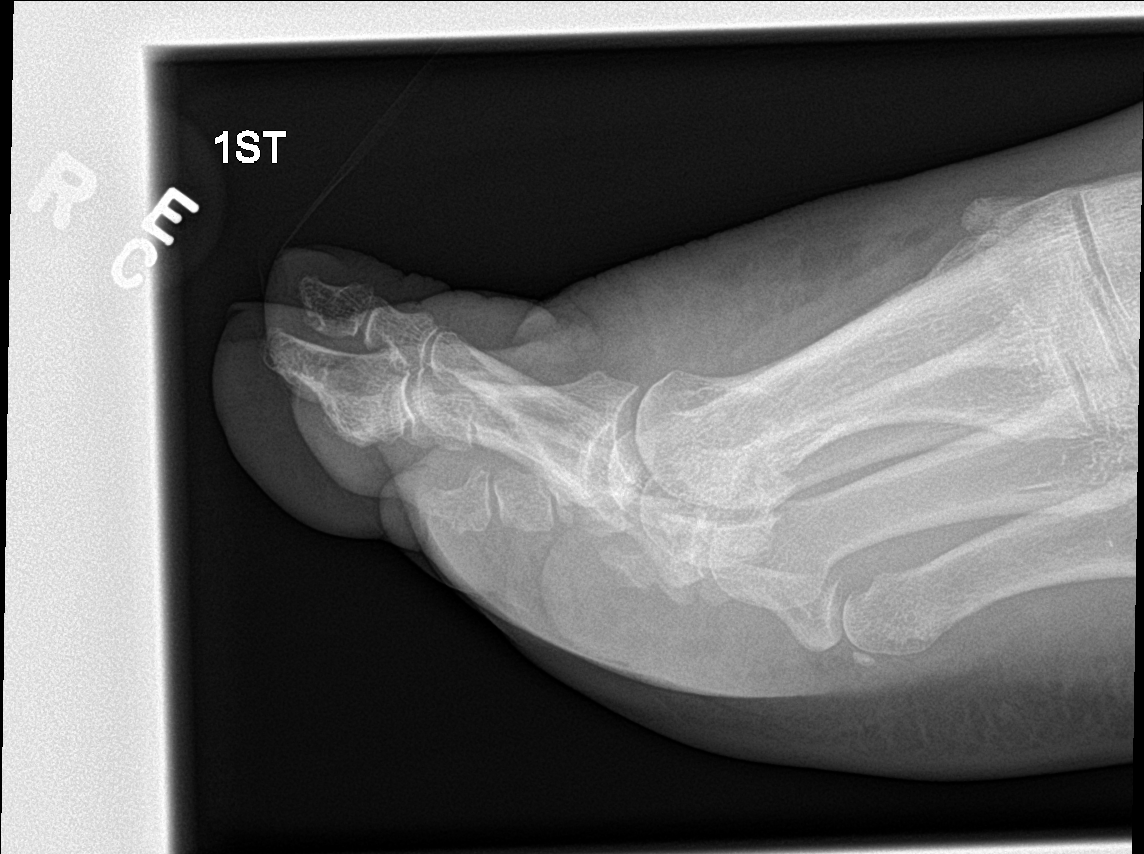

[3 of 3 positions shown; findings below may reference images not displayed]

FINDINGS: Soft tissue swelling at the first MTP joint. Joint spaces relatively
preserved. No fracture. Erosion at the head of the first metatarsal.
IMPRESSION: 1. No acute fracture or malalignment
2. Soft tissue swelling at the first MTP joint. Well-defined
erosions at the head of the first metatarsal, could be seen with
gouty disease.

## 2019-08-18 ENCOUNTER — Other Ambulatory Visit: Payer: Self-pay | Admitting: Family Medicine

## 2019-08-18 DIAGNOSIS — M159 Polyosteoarthritis, unspecified: Secondary | ICD-10-CM

## 2019-10-14 ENCOUNTER — Encounter: Payer: Self-pay | Admitting: Urology

## 2019-10-14 ENCOUNTER — Other Ambulatory Visit: Payer: Self-pay

## 2019-10-14 ENCOUNTER — Ambulatory Visit (INDEPENDENT_AMBULATORY_CARE_PROVIDER_SITE_OTHER): Payer: Medicare Other | Admitting: Urology

## 2019-10-14 VITALS — BP 130/85 | HR 80 | Ht 65.0 in | Wt 252.0 lb

## 2019-10-14 DIAGNOSIS — R319 Hematuria, unspecified: Secondary | ICD-10-CM

## 2019-10-14 DIAGNOSIS — R3129 Other microscopic hematuria: Secondary | ICD-10-CM

## 2019-10-14 DIAGNOSIS — R3121 Asymptomatic microscopic hematuria: Secondary | ICD-10-CM | POA: Diagnosis not present

## 2019-10-14 NOTE — Progress Notes (Signed)
   10/14/19 2:37 PM   Erin Goodman 1940-07-03 [id number]  CC: Microscopic hematuria, history of nephrolithiasis  HPI: I saw Erin Goodman in urology clinic for evaluation of microscopic hematuria and a history of nephrolithiasis.  She has a very vague history of reportedly having a kidney stone previously that was seen on an x-ray when she was having some pain, but she does not remember passing the stone.  She has never had surgery for stones before.  She does denies any groin or flank pain, but has some mild chronic back pain.  She denies any gross hematuria.  She reports her primary has told her multiple times over the last few months that she has had blood in the urine, but this is never been evaluated.  There is no recent imaging to review.  The last abdominal imaging was a renal ultrasound and KUB in April 2016 which were both completely normal with no evidence of stone disease.  She reports some occasional mild dysuria but otherwise denies any significant urinary symptoms.  Urinalysis today notable for 11-30 WBCs, 3-10 RBCs, no bacteria, no crystals, no yeast, nitrite negative, 2+ leukocytes.   PMH: Past Medical History:  Diagnosis Date  . Hypertension     Surgical History: Past Surgical History:  Procedure Laterality Date  . ABDOMINAL HYSTERECTOMY      Social History:  reports that she has quit smoking. She has never used smokeless tobacco. She reports current alcohol use. She reports that she does not use drugs.  Physical Exam: BP 130/85   Pulse 80   Ht 5\' 5"  (1.651 m)   Wt 252 lb (114.3 kg)   BMI 41.93 kg/m    Constitutional:  Alert and oriented, No acute distress. Cardiovascular: No clubbing, cyanosis, or edema. Respiratory: Normal respiratory effort, no increased work of breathing. GI: Abdomen is soft, nontender, nondistended, no abdominal masses  Laboratory Data: Reviewed, see HPI  Pertinent Imaging: Reviewed, see HPI  Assessment & Plan:   In summary, she is a  79 year old female with what sounds like a long history of microscopic hematuria, and a distant history of a spontaneously passed stone.  She is minimally symptomatic today but has persistent microscopic hematuria on urinalysis today with no clear evidence of infection.  We discussed common possible etiologies of microscopic hematuria including idiopathic, urolithiasis, medical renal disease, and malignancy. We discussed the new asymptomatic microscopic hematuria guidelines and risk categories of low, intermediate, and high risk that are based on age, risk factors like smoking, and degree of microscopic hematuria. We discussed work-up can range from repeat urinalysis, renal ultrasound and cystoscopy, to CT urogram and cystoscopy.  They fall into the high risk category, and I recommended proceeding with CT urogram and cystoscopy.  We will call with urine culture results, and can defer cystoscopy if urine culture is positive would recommend treating with antibiotics.  Call with urine culture and CT results Consider cystoscopy if urine culture negative   71, MD 10/14/2019  [facility] [address] [place], [place] [phone]

## 2019-10-18 ENCOUNTER — Telehealth: Payer: Self-pay

## 2019-10-18 LAB — URINALYSIS, COMPLETE
Bilirubin, UA: NEGATIVE
Glucose, UA: NEGATIVE
Ketones, UA: NEGATIVE
Nitrite, UA: NEGATIVE
Protein,UA: NEGATIVE
Specific Gravity, UA: 1.02 (ref 1.005–1.030)
Urobilinogen, Ur: 0.2 mg/dL (ref 0.2–1.0)
pH, UA: 7 (ref 5.0–7.5)

## 2019-10-18 LAB — CULTURE, URINE COMPREHENSIVE

## 2019-10-18 LAB — MICROSCOPIC EXAMINATION: Bacteria, UA: NONE SEEN

## 2019-10-18 NOTE — Telephone Encounter (Signed)
Called pt informed her of the information below. Pt gave verbal understanding.  

## 2019-10-18 NOTE — Telephone Encounter (Signed)
-----   Message from Sondra Come, MD sent at 10/16/2019 11:23 AM EDT ----- No infection on Urine culture, will call with ct results  Legrand Rams, MD 10/16/2019

## 2019-11-12 ENCOUNTER — Ambulatory Visit
Admission: RE | Admit: 2019-11-12 | Discharge: 2019-11-12 | Disposition: A | Discharge status: Home / Self Care | Payer: Medicare Other | Source: Ambulatory Visit | Attending: Urology | Admitting: Urology

## 2019-11-12 ENCOUNTER — Other Ambulatory Visit: Payer: Self-pay

## 2019-11-12 DIAGNOSIS — R3129 Other microscopic hematuria: Secondary | ICD-10-CM | POA: Diagnosis present

## 2019-11-12 LAB — POCT I-STAT CREATININE: Creatinine, Ser: 1.1 mg/dL — ABNORMAL HIGH (ref 0.44–1.00)

## 2019-11-12 MED ORDER — IOHEXOL 300 MG/ML  SOLN
125.0000 mL | Freq: Once | INTRAMUSCULAR | Status: AC | PRN
  Administered 2019-11-12: 125 mL via INTRAVENOUS

## 2019-12-02 ENCOUNTER — Telehealth: Payer: Self-pay

## 2019-12-02 NOTE — Telephone Encounter (Signed)
Called pt informed her of the information below. Pt gave verbal understanding. Appt scheduled.  

## 2019-12-02 NOTE — Telephone Encounter (Signed)
-----   Message from Sondra Come, MD sent at 12/02/2019  8:22 AM EDT ----- CT normal, please schedule 96mo follow up with UA, will need to consider cysto if persistent microscopic hematuria  Legrand Rams, MD 12/02/2019

## 2020-05-29 ENCOUNTER — Other Ambulatory Visit: Payer: Self-pay | Admitting: Family Medicine

## 2020-05-29 DIAGNOSIS — Z1231 Encounter for screening mammogram for malignant neoplasm of breast: Secondary | ICD-10-CM

## 2020-06-01 ENCOUNTER — Ambulatory Visit: Payer: Self-pay | Admitting: Urology

## 2020-06-14 ENCOUNTER — Ambulatory Visit
Admission: RE | Admit: 2020-06-14 | Discharge: 2020-06-14 | Disposition: A | Discharge status: Home / Self Care | Payer: Medicare Other | Source: Ambulatory Visit | Attending: Family Medicine | Admitting: Family Medicine

## 2020-06-14 ENCOUNTER — Other Ambulatory Visit: Payer: Self-pay

## 2020-06-14 DIAGNOSIS — Z1231 Encounter for screening mammogram for malignant neoplasm of breast: Secondary | ICD-10-CM | POA: Diagnosis present

## 2020-06-20 ENCOUNTER — Other Ambulatory Visit: Payer: Self-pay | Admitting: Family Medicine

## 2020-06-20 DIAGNOSIS — N6489 Other specified disorders of breast: Secondary | ICD-10-CM

## 2020-06-20 DIAGNOSIS — R928 Other abnormal and inconclusive findings on diagnostic imaging of breast: Secondary | ICD-10-CM

## 2020-07-07 ENCOUNTER — Ambulatory Visit
Admission: RE | Admit: 2020-07-07 | Discharge: 2020-07-07 | Disposition: A | Discharge status: Home / Self Care | Payer: Medicare Other | Source: Ambulatory Visit | Attending: Family Medicine | Admitting: Family Medicine

## 2020-07-07 ENCOUNTER — Other Ambulatory Visit: Payer: Self-pay

## 2020-07-07 DIAGNOSIS — R928 Other abnormal and inconclusive findings on diagnostic imaging of breast: Secondary | ICD-10-CM

## 2020-07-07 DIAGNOSIS — N6489 Other specified disorders of breast: Secondary | ICD-10-CM | POA: Diagnosis present

## 2020-07-11 ENCOUNTER — Other Ambulatory Visit: Payer: Self-pay | Admitting: Family Medicine

## 2020-07-11 DIAGNOSIS — R928 Other abnormal and inconclusive findings on diagnostic imaging of breast: Secondary | ICD-10-CM

## 2020-07-17 ENCOUNTER — Ambulatory Visit
Admission: RE | Admit: 2020-07-17 | Discharge: 2020-07-17 | Disposition: A | Discharge status: Home / Self Care | Payer: Medicare Other | Source: Ambulatory Visit | Attending: Family Medicine | Admitting: Family Medicine

## 2020-07-17 ENCOUNTER — Other Ambulatory Visit: Payer: Self-pay

## 2020-07-17 DIAGNOSIS — R928 Other abnormal and inconclusive findings on diagnostic imaging of breast: Secondary | ICD-10-CM | POA: Diagnosis not present

## 2020-07-17 HISTORY — PX: BREAST BIOPSY: SHX20

## 2020-07-18 LAB — SURGICAL PATHOLOGY

## 2020-10-24 ENCOUNTER — Encounter: Payer: Self-pay | Admitting: Internal Medicine

## 2020-10-25 ENCOUNTER — Ambulatory Visit
Admission: RE | Admit: 2020-10-25 | Discharge: 2020-10-25 | Disposition: A | Discharge status: Home / Self Care | Payer: Medicare Other | Attending: Internal Medicine | Admitting: Internal Medicine

## 2020-10-25 ENCOUNTER — Other Ambulatory Visit: Payer: Self-pay

## 2020-10-25 ENCOUNTER — Ambulatory Visit: Payer: Medicare Other | Admitting: Anesthesiology

## 2020-10-25 ENCOUNTER — Encounter: Payer: Self-pay | Admitting: Internal Medicine

## 2020-10-25 ENCOUNTER — Encounter
Admission: RE | Disposition: A | Discharge status: Home / Self Care | Payer: Self-pay | Source: Home / Self Care | Attending: Internal Medicine

## 2020-10-25 DIAGNOSIS — K5730 Diverticulosis of large intestine without perforation or abscess without bleeding: Secondary | ICD-10-CM | POA: Insufficient documentation

## 2020-10-25 DIAGNOSIS — Z86010 Personal history of colonic polyps: Secondary | ICD-10-CM | POA: Insufficient documentation

## 2020-10-25 DIAGNOSIS — Z79899 Other long term (current) drug therapy: Secondary | ICD-10-CM | POA: Insufficient documentation

## 2020-10-25 DIAGNOSIS — I10 Essential (primary) hypertension: Secondary | ICD-10-CM | POA: Diagnosis not present

## 2020-10-25 DIAGNOSIS — K640 First degree hemorrhoids: Secondary | ICD-10-CM | POA: Diagnosis not present

## 2020-10-25 DIAGNOSIS — Z1211 Encounter for screening for malignant neoplasm of colon: Secondary | ICD-10-CM | POA: Diagnosis not present

## 2020-10-25 DIAGNOSIS — Z880 Allergy status to penicillin: Secondary | ICD-10-CM | POA: Diagnosis not present

## 2020-10-25 HISTORY — PX: COLONOSCOPY WITH PROPOFOL: SHX5780

## 2020-10-25 HISTORY — DX: Gout, unspecified: M10.9

## 2020-10-25 SURGERY — COLONOSCOPY WITH PROPOFOL
Anesthesia: General

## 2020-10-25 MED ORDER — SODIUM CHLORIDE 0.9 % IV SOLN: INTRAVENOUS | Status: DC

## 2020-10-25 MED ORDER — PROPOFOL 500 MG/50ML IV EMUL
INTRAVENOUS | Status: DC | PRN
  Administered 2020-10-25: 200 ug/kg/min via INTRAVENOUS

## 2020-10-25 MED ORDER — SODIUM CHLORIDE 0.9 % IV SOLN: INTRAVENOUS | Status: DC | PRN

## 2020-10-25 MED ORDER — PROPOFOL 10 MG/ML IV BOLUS
INTRAVENOUS | Status: DC | PRN
  Administered 2020-10-25: 50 mg via INTRAVENOUS
  Administered 2020-10-25 (×2): 10 mg via INTRAVENOUS

## 2020-10-25 NOTE — Transfer of Care (Signed)
Immediate Anesthesia Transfer of Care Note  Patient: TOLA MEAS  Procedure(s) Performed: COLONOSCOPY WITH PROPOFOL  Patient Location: PACU  Anesthesia Type:General  Level of Consciousness: awake, alert  and oriented  Airway & Oxygen Therapy: Patient Spontanous Breathing and Patient connected to nasal cannula oxygen  Post-op Assessment: Report given to RN and Post -op Vital signs reviewed and stable  Post vital signs: Reviewed and stable  Last Vitals:  Vitals Value Taken Time  BP 147/96 10/25/20 1312  Temp    Pulse 91 10/25/20 1313  Resp 27 10/25/20 1313  SpO2 95 % 10/25/20 1313  Vitals shown include unvalidated device data.  Last Pain:  Vitals:   10/25/20 1311  TempSrc:   PainSc: 0-No pain         Complications: No notable events documented.

## 2020-10-25 NOTE — Anesthesia Preprocedure Evaluation (Addendum)
Anesthesia Evaluation  Patient identified by MRN, date of birth, ID band Patient awake    Reviewed: Allergy & Precautions, NPO status , Patient's Chart, lab work & pertinent test results  History of Anesthesia Complications Negative for: history of anesthetic complications  Airway Mallampati: III  TM Distance: >3 FB Neck ROM: full    Dental  (+) Chipped, Upper Dentures   Pulmonary neg shortness of breath, former smoker,    Pulmonary exam normal        Cardiovascular Exercise Tolerance: Good hypertension, (-) anginaNormal cardiovascular exam     Neuro/Psych negative neurological ROS  negative psych ROS   GI/Hepatic negative GI ROS, Neg liver ROS,   Endo/Other  negative endocrine ROS  Renal/GU negative Renal ROS  negative genitourinary   Musculoskeletal   Abdominal   Peds  Hematology negative hematology ROS (+)   Anesthesia Other Findings Past Medical History: No date: Gout No date: Hypertension  Past Surgical History: No date: ABDOMINAL HYSTERECTOMY 07/17/2020: BREAST BIOPSY; Left     Comment:  Stereo Bx, ribbon clip, path pending      Reproductive/Obstetrics negative OB ROS                            Anesthesia Physical Anesthesia Plan  ASA: 3  Anesthesia Plan: General   Post-op Pain Management:    Induction: Intravenous  PONV Risk Score and Plan: Propofol infusion and TIVA  Airway Management Planned: Natural Airway and Nasal Cannula  Additional Equipment:   Intra-op Plan:   Post-operative Plan:   Informed Consent: I have reviewed the patients History and Physical, chart, labs and discussed the procedure including the risks, benefits and alternatives for the proposed anesthesia with the patient or authorized representative who has indicated his/her understanding and acceptance.     Dental Advisory Given  Plan Discussed with: Anesthesiologist, CRNA and  Surgeon  Anesthesia Plan Comments: (Patient consented for risks of anesthesia including but not limited to:  - adverse reactions to medications - risk of airway placement if required - damage to eyes, teeth, lips or other oral mucosa - nerve damage due to positioning  - sore throat or hoarseness - Damage to heart, brain, nerves, lungs, other parts of body or loss of life  Patient voiced understanding.)        Anesthesia Quick Evaluation

## 2020-10-25 NOTE — Anesthesia Postprocedure Evaluation (Signed)
Anesthesia Post Note  Patient: Erin Goodman  Procedure(s) Performed: COLONOSCOPY WITH PROPOFOL  Patient location during evaluation: Endoscopy Anesthesia Type: General Level of consciousness: awake and alert Pain management: pain level controlled Vital Signs Assessment: post-procedure vital signs reviewed and stable Respiratory status: spontaneous breathing, nonlabored ventilation, respiratory function stable and patient connected to nasal cannula oxygen Cardiovascular status: blood pressure returned to baseline and stable Postop Assessment: no apparent nausea or vomiting Anesthetic complications: no   No notable events documented.   Last Vitals:  Vitals:   10/25/20 1311 10/25/20 1331  BP: (!) 147/96 129/76  Pulse:    Resp:    Temp:    SpO2:      Last Pain:  Vitals:   10/25/20 1331  TempSrc:   PainSc: 0-No pain                 Erin Goodman

## 2020-10-25 NOTE — Interval H&P Note (Signed)
History and Physical Interval Note:  10/25/2020 12:41 PM  Erin Goodman  has presented today for surgery, with the diagnosis of P HX ADEN POLYPS.  The various methods of treatment have been discussed with the patient and family. After consideration of risks, benefits and other options for treatment, the patient has consented to  Procedure(s): COLONOSCOPY WITH PROPOFOL (N/A) as a surgical intervention.  The patient's history has been reviewed, patient examined, no change in status, stable for surgery.  I have reviewed the patient's chart and labs.  Questions were answered to the patient's satisfaction.     [place], [place]

## 2020-10-25 NOTE — H&P (Signed)
Outpatient short stay form Pre-procedure 10/25/2020 12:39 PM Linda Biehn K. Norma Fredrickson, M.D.  Primary Physician: Huntley Dec, PA [facility])  Reason for visit:  Personal history of adenomatous colon polyps (2015)  History of present illness:                            Patient presents for colonoscopy for a personal hx of colon polyps. The patient denies abdominal pain, abnormal weight loss or rectal bleeding.      Current Facility-Administered Medications:    0.9 %  sodium chloride infusion, , Intravenous, Continuous, Nysa Sarin, Boykin Nearing, MD  Medications Prior to Admission  Medication Sig Dispense Refill Last Dose   allopurinol (ZYLOPRIM) 100 MG tablet Take 100 mg by mouth daily.   10/24/2020   Ascorbic Acid (VITAMIN C) 1000 MG tablet Take 1,000 mg by mouth daily.   Past Week   atorvastatin (LIPITOR) 20 MG tablet Take 20 mg by mouth daily.      cholecalciferol (VITAMIN D) 25 MCG (1000 UNIT) tablet Take 1,000 Units by mouth daily.   Past Week   diphenhydrAMINE (BENADRYL) 25 mg capsule Take 25 mg by mouth every 6 (six) hours as needed.      Ginger Oil OIL by Does not apply route.   Past Week   Red Yeast Rice 600 MG CAPS Take by mouth.   Past Week   triamterene-hydrochlorothiazide (MAXZIDE-25) 37.5-25 MG tablet Take 1 tablet by mouth daily.   10/24/2020   Turmeric 500 MG CAPS Take by mouth.   Past Week   asenapine (SAPHRIS) 5 MG SUBL 24 hr tablet Place 5 mg under the tongue 2 (two) times daily. (Patient not taking: Reported on 10/25/2020)   Not Taking   oxyCODONE-acetaminophen (PERCOCET/ROXICET) 5-325 MG tablet Take 2 tablets by mouth every 4 (four) hours as needed for severe pain. 12 tablet 0    predniSONE (DELTASONE) 20 MG tablet Take 2 tablets (40 mg total) by mouth daily. 12 tablet 0      Allergies  Allergen Reactions   Penicillins    Shellfish Allergy Itching     Past Medical History:  Diagnosis Date   Gout    Hypertension     Review of systems:  Otherwise negative.     Physical Exam  Gen: Alert, oriented. Appears stated age.  HEENT: [place]/AT. PERRLA. Lungs: CTA, no wheezes. CV: RR nl S1, S2. Abd: soft, benign, no masses. BS+ Ext: No edema. Pulses 2+    Planned procedures: Proceed with colonoscopy. The patient understands the nature of the planned procedure, indications, risks, alternatives and potential complications including but not limited to bleeding, infection, perforation, damage to internal organs and possible oversedation/side effects from anesthesia. The patient agrees and gives consent to proceed.  Please refer to procedure notes for findings, recommendations and patient disposition/instructions.     Marguerite Barba K. Norma Fredrickson, M.D. Gastroenterology 10/25/2020  12:39 PM

## 2020-10-25 NOTE — Op Note (Signed)
[facility] Gastroenterology Patient Name: Erin Goodman Procedure Date: 10/25/2020 12:18 PM MRN: [id number] Account #: [id number] Date of Birth: Jan 08, 1941 Admit Type: Outpatient Age: 80 Room: [facility] ENDO ROOM 2 Gender: Female Note Status: Finalized Procedure:             Colonoscopy Indications:           Surveillance: Personal history of adenomatous polyps                         on last colonoscopy > 5 years ago Providers:             Royce Macadamia K. Panagiotis Oelkers MD, MD Medicines:             Propofol per Anesthesia Complications:         No immediate complications. Procedure:             Pre-Anesthesia Assessment:                        - The risks and benefits of the procedure and the                         sedation options and risks were discussed with the                         patient. All questions were answered and informed                         consent was obtained.                        - Patient identification and proposed procedure were                         verified prior to the procedure by the nurse. The                         procedure was verified in the procedure room.                        - ASA Grade Assessment: III - A patient with severe                         systemic disease.                        - After reviewing the risks and benefits, the patient                         was deemed in satisfactory condition to undergo the                         procedure.                        After obtaining informed consent, the colonoscope was                         passed under direct vision. Throughout the procedure,  the patient's blood pressure, pulse, and oxygen                         saturations were monitored continuously. The                         Colonoscope was introduced through the anus and                         advanced to the the cecum, identified by appendiceal                         orifice and ileocecal  valve. The colonoscopy was                         performed without difficulty. The patient tolerated                         the procedure well. The quality of the bowel                         preparation was excellent. The ileocecal valve,                         appendiceal orifice, and rectum were photographed. Findings:      The perianal and digital rectal examinations were normal. Pertinent       negatives include normal sphincter tone and no palpable rectal lesions.      Non-bleeding internal hemorrhoids were found during retroflexion. The       hemorrhoids were Grade I (internal hemorrhoids that do not prolapse).      Multiple small and large-mouthed diverticula were found in the sigmoid       colon.      The exam was otherwise without abnormality. Impression:            - Non-bleeding internal hemorrhoids.                        - Diverticulosis in the sigmoid colon.                        - The examination was otherwise normal.                        - No specimens collected. Recommendation:        - Patient has a contact number available for                         emergencies. The signs and symptoms of potential                         delayed complications were discussed with the patient.                         Return to normal activities tomorrow. Written                         discharge instructions were provided to the patient.                        -  High fiber diet.                        - Continue present medications.                        - You do NOT require further colon cancer screening                         measures (Annual stool testing (i.e. hemoccult, FIT,                         cologuard), sigmoidoscopy, colonoscopy or CT                         colonography). You should share this recommendation                         with your Primary Care provider.                        - Return to GI office PRN.                        - The findings and  recommendations were discussed with                         the patient. Procedure Code(s):     --- Professional ---                        R4270, Colorectal cancer screening; colonoscopy on                         individual at high risk Diagnosis Code(s):     --- Professional ---                        K57.30, Diverticulosis of large intestine without                         perforation or abscess without bleeding                        K64.0, First degree hemorrhoids                        Z86.010, Personal history of colonic polyps CPT copyright 2019 American Medical Association. All rights reserved. The codes documented in this report are preliminary and upon coder review may  be revised to meet current compliance requirements. Stanton Kidney MD, MD 10/25/2020 1:13:15 PM This report has been signed electronically. Number of Addenda: 0 Note Initiated On: 10/25/2020 12:18 PM Scope Withdrawal Time: 0 hours 6 minutes 11 seconds  Total Procedure Duration: 0 hours 9 minutes 26 seconds  Estimated Blood Loss:  Estimated blood loss: none.      [facility]

## 2020-10-26 ENCOUNTER — Encounter: Payer: Self-pay | Admitting: Internal Medicine

## 2021-01-08 ENCOUNTER — Emergency Department
Admission: EM | Admit: 2021-01-08 | Discharge: 2021-01-08 | Disposition: A | Discharge status: Home / Self Care | Payer: Medicare Other | Attending: Emergency Medicine | Admitting: Emergency Medicine

## 2021-01-08 ENCOUNTER — Emergency Department: Payer: Medicare Other

## 2021-01-08 ENCOUNTER — Other Ambulatory Visit: Payer: Self-pay

## 2021-01-08 DIAGNOSIS — M545 Low back pain, unspecified: Secondary | ICD-10-CM | POA: Diagnosis present

## 2021-01-08 DIAGNOSIS — N3000 Acute cystitis without hematuria: Secondary | ICD-10-CM | POA: Diagnosis not present

## 2021-01-08 DIAGNOSIS — Z79899 Other long term (current) drug therapy: Secondary | ICD-10-CM | POA: Insufficient documentation

## 2021-01-08 DIAGNOSIS — Z87891 Personal history of nicotine dependence: Secondary | ICD-10-CM | POA: Insufficient documentation

## 2021-01-08 DIAGNOSIS — I10 Essential (primary) hypertension: Secondary | ICD-10-CM | POA: Insufficient documentation

## 2021-01-08 LAB — URINALYSIS, ROUTINE W REFLEX MICROSCOPIC
Bilirubin Urine: NEGATIVE
Glucose, UA: NEGATIVE mg/dL
Ketones, ur: NEGATIVE mg/dL
Nitrite: NEGATIVE
Protein, ur: 30 mg/dL — AB
Specific Gravity, Urine: 1.017 (ref 1.005–1.030)
pH: 6 (ref 5.0–8.0)

## 2021-01-08 MED ORDER — OXYCODONE-ACETAMINOPHEN 5-325 MG PO TABS
1.0000 | ORAL_TABLET | Freq: Once | ORAL | Status: AC
Start: 2021-01-08 — End: 2021-01-08
  Administered 2021-01-08: 1 via ORAL
  Filled 2021-01-08: qty 1

## 2021-01-08 MED ORDER — LIDOCAINE 5 % EX PTCH: 1.0000 | MEDICATED_PATCH | CUTANEOUS | 0 refills | Status: DC

## 2021-01-08 MED ORDER — NAPROXEN 250 MG PO TABS: 250.0000 mg | ORAL_TABLET | Freq: Two times a day (BID) | ORAL | 0 refills | Status: AC

## 2021-01-08 MED ORDER — NITROFURANTOIN MONOHYD MACRO 100 MG PO CAPS: 100.0000 mg | ORAL_CAPSULE | Freq: Two times a day (BID) | ORAL | 0 refills | Status: AC
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
## 2021-01-08 NOTE — Discharge Instructions (Addendum)
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  I would like you to take naproxen twice daily for 1 week.  Pain and you can also try the Lidoderm patch.  He should not take ibuprofen or any other NSAID along with the naproxen.  You can however take Tylenol with it.  Please follow-up with neurosurgery for further evaluation of your pain.  You also have a urinary tract infection.  Please take the Macrobid twice daily for the next 5 days for this.  Please return to the emergency department if you develop numbness or weakness in your lower extremity or fevers, chills or inability to urinate.

## 2021-01-08 NOTE — ED Provider Notes (Signed)
[facility]  ____________________________________________   Event Date/Time   First MD Initiated Contact with Patient 01/08/21 0740     (approximate)  I have reviewed the triage vital signs and the nursing notes.   HISTORY  Chief Complaint Back Pain    HPI Erin Goodman is a 80 y.o. female with past medical history of gout and hypertension who presents with back pain.  Symptoms started about 4 days ago.  Pain is located in the right lower back as well as the midline.  It is a constant pain with no clear exacerbating or alleviating factors.  There is no radiation to the pain she denies any associated numbness, weakness, saddle anesthesia or bowel or bladder incontinence.  No fevers or chills.  Patient has noticed urinary frequency and some dysuria over the past week.  Has had nausea but no vomiting.  Patient has a history of sciatica and while this does feel somewhat similar it is worse.  Patient also has a history of kidney stones and said that this does feel like kidney stone.  Patient has tried Tylenol, Advil, and a painkiller that was prescribed to her husband after surgery.         Past Medical History:  Diagnosis Date   Gout    Hypertension     There are no problems to display for this patient.   Past Surgical History:  Procedure Laterality Date   ABDOMINAL HYSTERECTOMY     BREAST BIOPSY Left 07/17/2020   Stereo Bx, ribbon clip, path pending    COLONOSCOPY WITH PROPOFOL N/A 10/25/2020   Procedure: COLONOSCOPY WITH PROPOFOL;  Surgeon: Toledo, Boykin Nearing, MD;  Location: [facility] ENDOSCOPY;  Service: Gastroenterology;  Laterality: N/A;    Prior to Admission medications   Medication Sig Start Date End Date Taking? Authorizing Provider  lidocaine (LIDODERM) 5 % Place 1 patch onto the skin daily. Remove & Discard patch within 12 hours or as directed by MD 01/08/21  Yes Georga Hacking, MD  naproxen (NAPROSYN) 250 MG tablet Take 1 tablet (250 mg  total) by mouth 2 (two) times daily with a meal for 7 days. 01/08/21 01/15/21 Yes Georga Hacking, MD  nitrofurantoin, macrocrystal-monohydrate, (MACROBID) 100 MG capsule Take 1 capsule (100 mg total) by mouth 2 (two) times daily for 5 days. 01/08/21 01/13/21 Yes Georga Hacking, MD  allopurinol (ZYLOPRIM) 100 MG tablet Take 100 mg by mouth daily.    [provider]  Ascorbic Acid (VITAMIN C) 1000 MG tablet Take 1,000 mg by mouth daily.    [provider]  asenapine (SAPHRIS) 5 MG SUBL 24 hr tablet Place 5 mg under the tongue 2 (two) times daily. Patient not taking: Reported on 10/25/2020    [provider]  atorvastatin (LIPITOR) 20 MG tablet Take 20 mg by mouth daily.    [provider]  cholecalciferol (VITAMIN D) 25 MCG (1000 UNIT) tablet Take 1,000 Units by mouth daily.    [provider]  diphenhydrAMINE (BENADRYL) 25 mg capsule Take 25 mg by mouth every 6 (six) hours as needed.    [provider]  Ginger Oil OIL by Does not apply route.    [provider]  oxyCODONE-acetaminophen (PERCOCET/ROXICET) 5-325 MG tablet Take 2 tablets by mouth every 4 (four) hours as needed for severe pain. 03/31/17   Rebecka Apley, MD  predniSONE (DELTASONE) 20 MG tablet Take 2 tablets (40 mg total) by mouth daily. 03/31/17   Rebecka Apley,  MD  Red Yeast Rice 600 MG CAPS Take by mouth.    [provider]  triamterene-hydrochlorothiazide (MAXZIDE-25) 37.5-25 MG tablet Take 1 tablet by mouth daily.    [provider]  Turmeric 500 MG CAPS Take by mouth.    [provider]    Allergies Penicillins and Shellfish allergy  Family History  Problem Relation Age of Onset   Breast cancer Neg Hx     Social History Social History   Tobacco Use   Smoking status: Former   Smokeless tobacco: Never  Substance Use Topics   Alcohol use: Yes    Comment: ocassionaly    Drug use: No    Review of Systems   Review of  Systems  Constitutional:  Negative for chills and fever.  Respiratory:  Negative for shortness of breath.   Gastrointestinal:  Positive for nausea. Negative for abdominal pain and vomiting.  Genitourinary:  Positive for dysuria and frequency.  Musculoskeletal:  Positive for back pain.  Neurological:  Negative for weakness and numbness.  All other systems reviewed and are negative.  Physical Exam Updated Vital Signs BP (!) 143/85   Pulse 68   Temp 98.7 F (37.1 C) (Oral)   Resp 16   Ht 5\' 5"  (1.651 m)   Wt 113.3 kg   SpO2 93%   BMI 41.57 kg/m   Physical Exam Vitals and nursing note reviewed.  Constitutional:      General: She is in acute distress.     Appearance: Normal appearance.     Comments: Patient appears uncomfortable, lying on her left side  HENT:     Head: Normocephalic and atraumatic.  Eyes:     General: No scleral icterus.    Conjunctiva/sclera: Conjunctivae normal.  Pulmonary:     Effort: Pulmonary effort is normal. No respiratory distress.     Breath sounds: No stridor.  Abdominal:     General: Abdomen is flat. There is no distension.     Tenderness: There is no abdominal tenderness. There is no right CVA tenderness or guarding.  Musculoskeletal:        General: No deformity or signs of injury.     Cervical back: Normal range of motion.     Comments: Patient has tenderness over the right SI joint and in the lumbar midline 5 strength with hip flexion, plantar flexion and dorsiflexion  Skin:    General: Skin is dry.     Coloration: Skin is not jaundiced or pale.  Neurological:     General: No focal deficit present.     Mental Status: She is alert and oriented to person, place, and time. Mental status is at baseline.  Psychiatric:        Mood and Affect: Mood normal.        Behavior: Behavior normal.     LABS (all labs ordered are listed, but only abnormal results are displayed)  Labs Reviewed  URINALYSIS, ROUTINE W REFLEX MICROSCOPIC - Abnormal;  Notable for the following components:      Result Value   Color, Urine YELLOW (*)    APPearance HAZY (*)    Hgb urine dipstick MODERATE (*)    Protein, ur 30 (*)    Leukocytes,Ua LARGE (*)    Bacteria, UA RARE (*)    All other components within normal limits   ____________________________________________  EKG  N/a ____________________________________________  RADIOLOGY Almeta Monas, personally viewed and evaluated these images (plain radiographs) as part of my medical decision  making, as well as reviewing the written report by the radiologist.  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  Pain is located in the right lower back as well as the right midline without radiation of symptoms.  She appears uncomfortable and does have tenderness over the right SI joint primarily.  She has full strength and sensation in her lower extremities, no other red flags based on history or exam for cauda equina syndrome.  UA was obtained given her urinary frequency and dysuria that is consistent with infection but also has significant RBCs.  Patient tells me she has a history of kidney stones and this feels somewhat similar.  She is really not tender over the CVA region however with this history we will obtain a CT renal study to rule out stone.  We will treat with Percocet for pain.  CT renal study is negative for stone.  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  Given she is otherwise neurologically intact no indication for urgent MRI at this point.   Discussed the findings with the patient.  Given her age NSAIDs are not ideal but given she has no other risks of bleeding and no history of coronary disease will write for a 7-day course of naproxen.  Discussed not taking other NSAIDs along with this as well as taking Tylenol and we will also write for Lidoderm patch.  We will give her a 5-day course of Macrobid for the UTI.  We will also refer to neurosurgery.      ____________________________________________   FINAL CLINICAL IMPRESSION(S) / ED DIAGNOSES  Final diagnoses:  Acute right-sided low back pain without sciatica  Acute cystitis without hematuria     ED Discharge Orders          Ordered    naproxen (NAPROSYN) 250 MG tablet  2 times daily with meals        01/08/21 0927    lidocaine (LIDODERM) 5 %  Every 24 hours        01/08/21 0927    nitrofurantoin, macrocrystal-monohydrate, (MACROBID) 100 MG capsule  2 times daily        01/08/21 [id number]             Note:  This document was prepared using Dragon voice recognition software and may include unintentional dictation errors.    Rada Hay, MD 01/08/21 [phone]

## 2021-01-08 NOTE — ED Triage Notes (Signed)
Pt presents to ER c/o lower back pain that has been going on for around 2 weeks but has become acutely worse in the last 3 days.  Pt states her back pain is worse in the right lower back.  Pt states she has had some increased urinary frequency.

## 2022-04-15 IMAGING — MG DIGITAL DIAGNOSTIC BILAT W/ TOMO W/ CAD
6 of 10 series · 6 of 30 positions shown · non-contrast
Comparison: Previous exam(s).

CLINICAL DATA: The patient was called back for a right breast
asymmetry and left breast possible distortion.

EXAM:
DIGITAL DIAGNOSTIC BILATERAL MAMMOGRAM WITH TOMOSYNTHESIS AND CAD
TECHNIQUE: Bilateral digital diagnostic mammography and breast tomosynthesis
was performed. The images were evaluated with computer-aided
detection.

[L ML synth-2D]
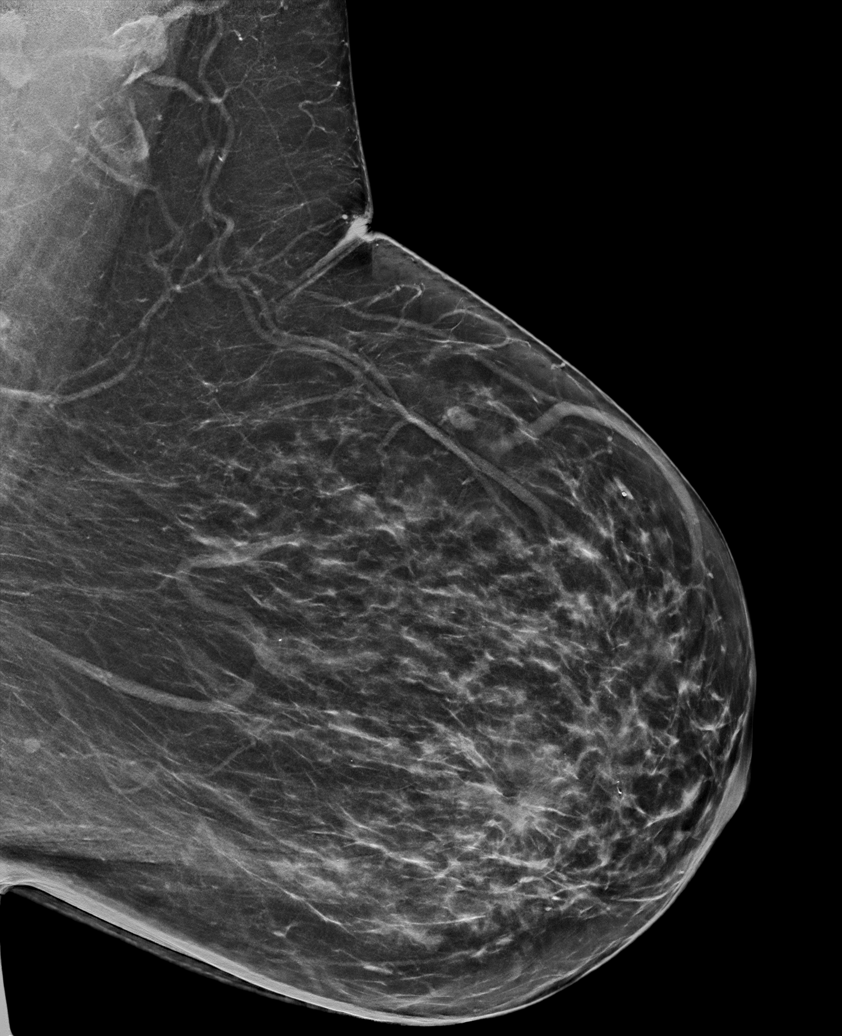

[R ML synth-2D]
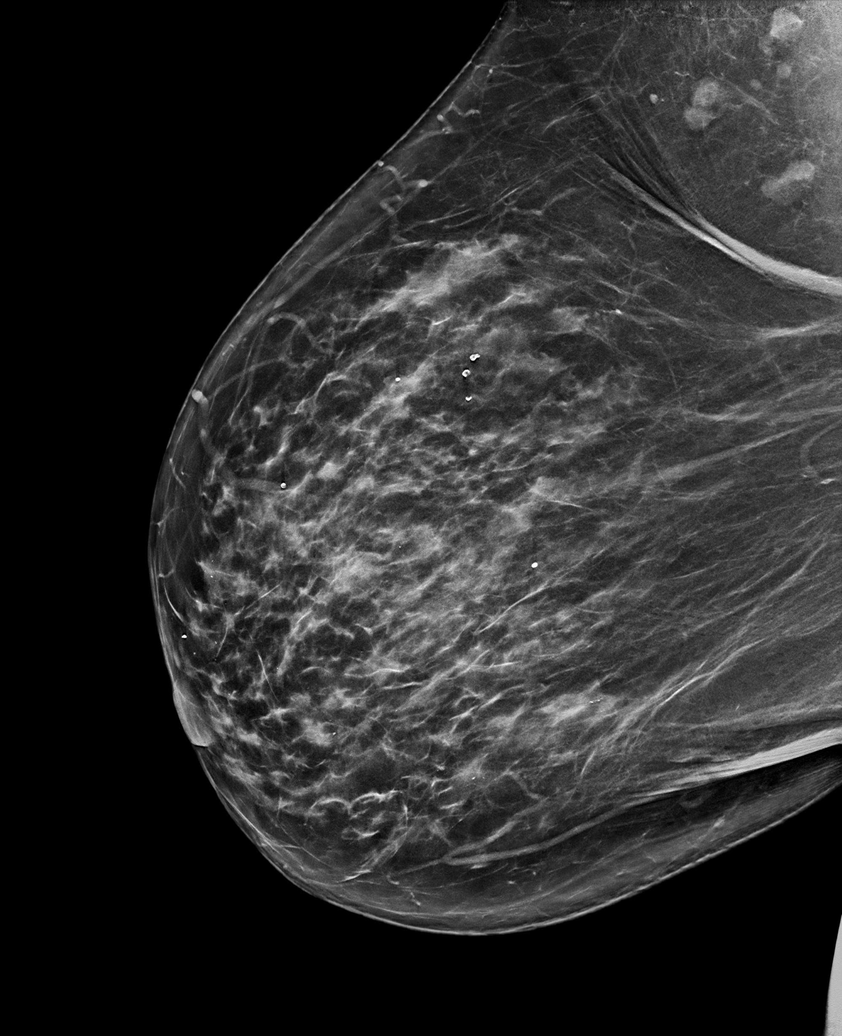

[R MLO synth-2D]
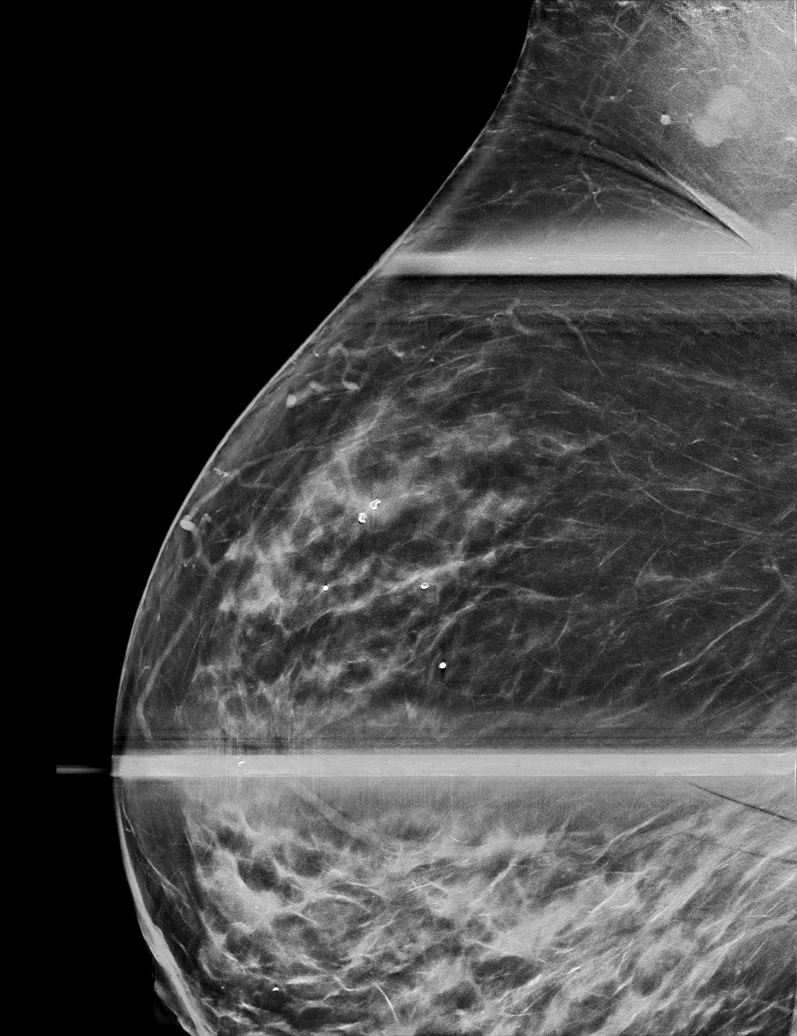

[L MLO synth-2D]
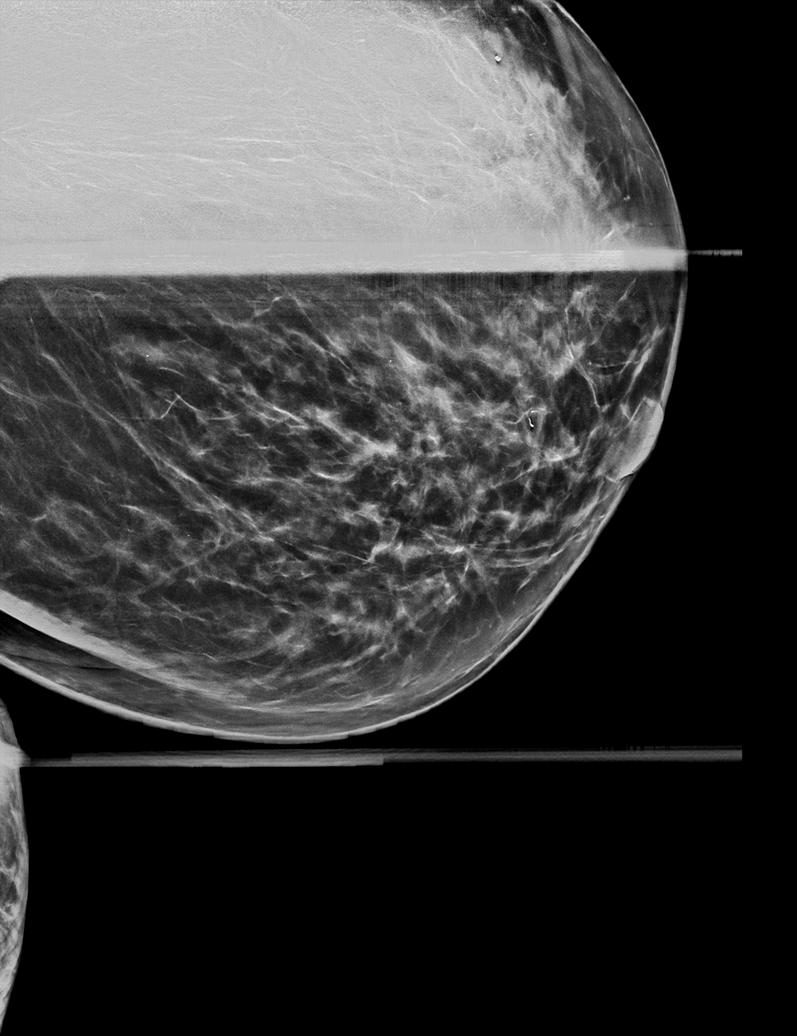

[L CC synth-2D]
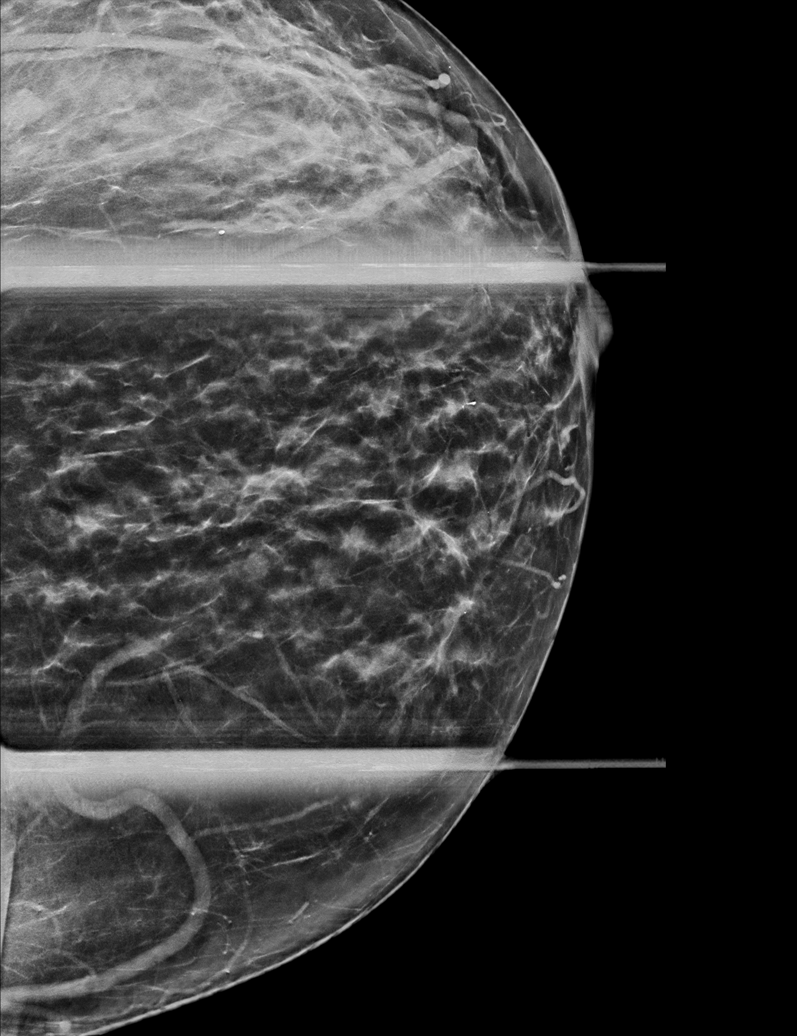

[R ML tomo · tomo slice 37/74.0]
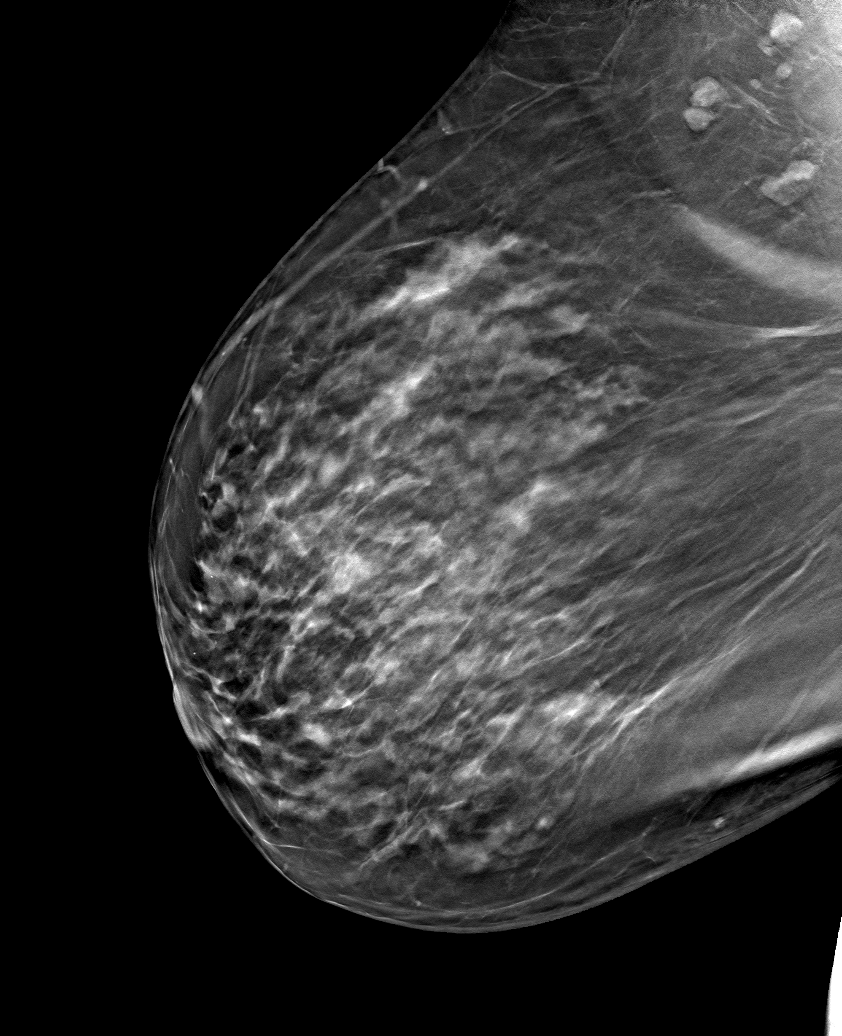

[6 of 30 positions shown; findings below may reference images not displayed]

ACR Breast Density Category c: The breast tissue is heterogeneously
dense, which may obscure small masses.
FINDINGS: The right asymmetry resolves into glandular tissue on today's
imaging. The possible distortion in the left breast seen medially on
the cc view and slightly inferiorly on the lateral view persists on
today's imaging.
IMPRESSION: Interval resolution of the right asymmetry. The possible left
distortion persists.

RECOMMENDATION:
Recommend stereotactic biopsy of the possible left breast
distortion.

I have discussed the findings and recommendations with the patient.
If applicable, a reminder letter will be sent to the patient
regarding the next appointment.

BI-RADS CATEGORY  4: Suspicious.

## 2022-05-13 DIAGNOSIS — N390 Urinary tract infection, site not specified: Secondary | ICD-10-CM | POA: Diagnosis not present

## 2022-05-13 DIAGNOSIS — Z1331 Encounter for screening for depression: Secondary | ICD-10-CM | POA: Diagnosis not present

## 2022-05-13 DIAGNOSIS — Z0131 Encounter for examination of blood pressure with abnormal findings: Secondary | ICD-10-CM | POA: Diagnosis not present

## 2022-05-13 DIAGNOSIS — Z1389 Encounter for screening for other disorder: Secondary | ICD-10-CM | POA: Diagnosis not present

## 2022-08-12 DIAGNOSIS — Z0131 Encounter for examination of blood pressure with abnormal findings: Secondary | ICD-10-CM | POA: Diagnosis not present

## 2022-08-12 DIAGNOSIS — Z1389 Encounter for screening for other disorder: Secondary | ICD-10-CM | POA: Diagnosis not present

## 2022-08-12 DIAGNOSIS — Z0130 Encounter for examination of blood pressure without abnormal findings: Secondary | ICD-10-CM | POA: Diagnosis not present

## 2022-08-12 DIAGNOSIS — N390 Urinary tract infection, site not specified: Secondary | ICD-10-CM | POA: Diagnosis not present

## 2022-08-13 DIAGNOSIS — Z008 Encounter for other general examination: Secondary | ICD-10-CM | POA: Diagnosis not present

## 2022-09-18 DIAGNOSIS — H2513 Age-related nuclear cataract, bilateral: Secondary | ICD-10-CM | POA: Diagnosis not present

## 2022-09-18 DIAGNOSIS — H25013 Cortical age-related cataract, bilateral: Secondary | ICD-10-CM | POA: Diagnosis not present

## 2022-09-18 DIAGNOSIS — H35033 Hypertensive retinopathy, bilateral: Secondary | ICD-10-CM | POA: Diagnosis not present

## 2022-09-30 DIAGNOSIS — H2511 Age-related nuclear cataract, right eye: Secondary | ICD-10-CM | POA: Diagnosis not present

## 2022-09-30 DIAGNOSIS — H2513 Age-related nuclear cataract, bilateral: Secondary | ICD-10-CM | POA: Diagnosis not present

## 2022-09-30 DIAGNOSIS — I10 Essential (primary) hypertension: Secondary | ICD-10-CM | POA: Diagnosis not present

## 2022-11-20 DIAGNOSIS — Z23 Encounter for immunization: Secondary | ICD-10-CM | POA: Diagnosis not present

## 2022-12-12 DIAGNOSIS — H2511 Age-related nuclear cataract, right eye: Secondary | ICD-10-CM | POA: Diagnosis not present

## 2022-12-13 DIAGNOSIS — H2512 Age-related nuclear cataract, left eye: Secondary | ICD-10-CM | POA: Diagnosis not present

## 2022-12-18 DIAGNOSIS — Z0130 Encounter for examination of blood pressure without abnormal findings: Secondary | ICD-10-CM | POA: Diagnosis not present

## 2022-12-18 DIAGNOSIS — Z23 Encounter for immunization: Secondary | ICD-10-CM | POA: Diagnosis not present

## 2022-12-18 DIAGNOSIS — Z1389 Encounter for screening for other disorder: Secondary | ICD-10-CM | POA: Diagnosis not present

## 2022-12-18 DIAGNOSIS — R300 Dysuria: Secondary | ICD-10-CM | POA: Diagnosis not present

## 2022-12-19 DIAGNOSIS — H2511 Age-related nuclear cataract, right eye: Secondary | ICD-10-CM | POA: Diagnosis not present

## 2022-12-23 DIAGNOSIS — M1712 Unilateral primary osteoarthritis, left knee: Secondary | ICD-10-CM | POA: Diagnosis not present

## 2022-12-23 DIAGNOSIS — I10 Essential (primary) hypertension: Secondary | ICD-10-CM | POA: Diagnosis not present

## 2022-12-23 DIAGNOSIS — Z87891 Personal history of nicotine dependence: Secondary | ICD-10-CM | POA: Diagnosis not present

## 2022-12-23 DIAGNOSIS — M7989 Other specified soft tissue disorders: Secondary | ICD-10-CM | POA: Diagnosis not present

## 2022-12-23 DIAGNOSIS — M160 Bilateral primary osteoarthritis of hip: Secondary | ICD-10-CM | POA: Diagnosis not present

## 2022-12-23 DIAGNOSIS — M79605 Pain in left leg: Secondary | ICD-10-CM | POA: Diagnosis not present

## 2022-12-23 DIAGNOSIS — Z880 Allergy status to penicillin: Secondary | ICD-10-CM | POA: Diagnosis not present

## 2022-12-23 DIAGNOSIS — R202 Paresthesia of skin: Secondary | ICD-10-CM | POA: Diagnosis not present

## 2022-12-23 DIAGNOSIS — M1612 Unilateral primary osteoarthritis, left hip: Secondary | ICD-10-CM | POA: Diagnosis not present

## 2022-12-26 DIAGNOSIS — H2512 Age-related nuclear cataract, left eye: Secondary | ICD-10-CM | POA: Diagnosis not present

## 2023-01-12 ENCOUNTER — Encounter: Payer: Self-pay | Admitting: Emergency Medicine

## 2023-01-12 ENCOUNTER — Ambulatory Visit
Admission: EM | Admit: 2023-01-12 | Discharge: 2023-01-12 | Disposition: A | Discharge status: Home / Self Care | Payer: Medicare HMO | Attending: Family Medicine | Admitting: Family Medicine

## 2023-01-12 ENCOUNTER — Ambulatory Visit: Payer: Medicare HMO

## 2023-01-12 DIAGNOSIS — G8929 Other chronic pain: Secondary | ICD-10-CM | POA: Diagnosis not present

## 2023-01-12 DIAGNOSIS — M79605 Pain in left leg: Secondary | ICD-10-CM | POA: Diagnosis not present

## 2023-01-12 MED ORDER — GABAPENTIN 100 MG PO CAPS: 100.0000 mg | ORAL_CAPSULE | Freq: Three times a day (TID) | ORAL | 0 refills | Status: DC | PRN

## 2023-01-12 MED ORDER — DEXAMETHASONE SODIUM PHOSPHATE 10 MG/ML IJ SOLN
10.0000 mg | Freq: Once | INTRAMUSCULAR | Status: AC
  Administered 2023-01-12: 10 mg via INTRAMUSCULAR

## 2023-01-12 NOTE — ED Provider Notes (Signed)
[facility]    CSN: [id number] Arrival date & time: 01/12/23  0904      History   Chief Complaint Chief Complaint  Patient presents with   Leg Pain    left    HPI  HPI Erin Goodman is a 82 y.o. female.   Erin Goodman presents for left lower leg pain with swelling for a month. Went to the ED on 10/27 and they did xray.  Has numbness in the thigh that goes to her feet. She told to start physical therapy which she started on Friday.  She was in front of the jet and thinks this may have irritated her leg.  She has been taking Advil and Tylenol while applied arthritis creams, heat and ice packs. Pain gets worse at night.       ***Injury occurred, mechanism and which ***shoulder injured.  Reports *** immediate pain in his shoulder ***. Did ***not hear any pop or abnormal sounds with his injury.  Continues to have *** description pain when moving his arm forward. Denies ***redness, swelling. Does not feel like her ***arm is weak. Has tried *** with little relief.  ***No change in pain day vs night.  Has ***never injured this *** shoulder before.  ***she is ***right handed.    Fever : no  Sore throat: no   Cough: no Appetite: normal  Hydration: normal  Abdominal pain: no Nausea: no Vomiting: no Sleep disturbance: no *** Back Pain: no Headache: no     Past Medical History:  Diagnosis Date   Gout    Hypertension     There are no problems to display for this patient.   Past Surgical History:  Procedure Laterality Date   ABDOMINAL HYSTERECTOMY     BREAST BIOPSY Left 07/17/2020   Stereo Bx, ribbon clip, path pending    COLONOSCOPY WITH PROPOFOL N/A 10/25/2020   Procedure: COLONOSCOPY WITH PROPOFOL;  Surgeon: Toledo, Boykin Nearing, MD;  Location: [facility] ENDOSCOPY;  Service: Gastroenterology;  Laterality: N/A;    OB History   No obstetric history on file.      Home Medications    Prior to Admission medications   Medication Sig Start Date End Date Taking?  Authorizing Provider  triamterene-hydrochlorothiazide (MAXZIDE-25) 37.5-25 MG tablet Take 1 tablet by mouth daily.   Yes [provider]  allopurinol (ZYLOPRIM) 100 MG tablet Take 100 mg by mouth daily.    [provider]  Ascorbic Acid (VITAMIN C) 1000 MG tablet Take 1,000 mg by mouth daily.    [provider]  asenapine (SAPHRIS) 5 MG SUBL 24 hr tablet Place 5 mg under the tongue 2 (two) times daily. Patient not taking: Reported on 10/25/2020    [provider]  atorvastatin (LIPITOR) 20 MG tablet Take 20 mg by mouth daily.    [provider]  cholecalciferol (VITAMIN D) 25 MCG (1000 UNIT) tablet Take 1,000 Units by mouth daily.    [provider]  diphenhydrAMINE (BENADRYL) 25 mg capsule Take 25 mg by mouth every 6 (six) hours as needed.    [provider]  Ginger Oil OIL by Does not apply route.    [provider]  lidocaine (LIDODERM) 5 % Place 1 patch onto the skin daily. Remove & Discard patch within 12 hours or as directed by MD 01/08/21   Georga Hacking, MD  oxyCODONE-acetaminophen (PERCOCET/ROXICET) 5-325 MG tablet Take 2 tablets by mouth every 4 (four) hours as needed for severe pain. 03/31/17  Rebecka Apley, MD  predniSONE (DELTASONE) 20 MG tablet Take 2 tablets (40 mg total) by mouth daily. 03/31/17   Rebecka Apley, MD  Red Yeast Rice 600 MG CAPS Take by mouth.    [provider]  Turmeric 500 MG CAPS Take by mouth.    [provider]    Family History Family History  Problem Relation Age of Onset   Breast cancer Neg Hx     Social History Social History   Tobacco Use   Smoking status: Former   Smokeless tobacco: Never  Advertising account planner   Vaping status: Never Used  Substance Use Topics   Alcohol use: Yes    Comment: ocassionaly    Drug use: No     Allergies   Penicillins and Shellfish allergy   Review of Systems Review of Systems: :negative unless otherwise stated in  HPI.      Physical Exam Triage Vital Signs ED Triage Vitals  Encounter Vitals Group     BP 01/12/23 0938 110/74     Systolic BP Percentile --      Diastolic BP Percentile --      Pulse Rate 01/12/23 0938 75     Resp 01/12/23 0938 14     Temp 01/12/23 0938 98.7 F (37.1 C)     Temp Source 01/12/23 0938 Oral     SpO2 01/12/23 0938 96 %     Weight 01/12/23 0935 249 lb 12.5 oz (113.3 kg)     Height 01/12/23 0935 5\' 5"  (1.651 m)     Head Circumference --      Peak Flow --      Pain Score 01/12/23 0935 8     Pain Loc --      Pain Education --      Exclude from Growth Chart --    No data found.  Updated Vital Signs BP 110/74 (BP Location: Left Arm)   Pulse 75   Temp 98.7 F (37.1 C) (Oral)   Resp 14   Ht 5\' 5"  (1.651 m)   Wt 113.3 kg   SpO2 96%   BMI 41.57 kg/m   Visual Acuity Right Eye Distance:   Left Eye Distance:   Bilateral Distance:    Right Eye Near:   Left Eye Near:    Bilateral Near:     Physical Exam GEN: well appearing female in no acute distress  CVS: well perfused  RESP: speaking in full sentences without pause, no respiratory distress  MSK:   *** shoulder:  No evidence of bony deformity, asymmetry, or muscle atrophy. No tenderness over long head of biceps (bicipital groove).  No TTP at [facility] joint.  Full active and passive (ABD, ADD, Flexion, extension, IR, ER). Limited *** 2/2 to pain.  Strength 5/5 grip, elbow and shoulder. No abnormal scapular function observed.  Special Tests: Juanetta Gosling: ***; Empty Can: ***, Neer's: Negative; Painful arc: Negative; Anterior Apprehension: Negative Sensation intact. Peripheral pulses intact.   UC Treatments / Results  Labs (all labs ordered are listed, but only abnormal results are displayed) Labs Reviewed - No data to display  EKG   Radiology No results found.   Procedures Procedures (including critical care time)  Medications Ordered in UC Medications - No data to display  Initial Impression /  Assessment and Plan / UC Course  I have reviewed the triage vital signs and the nursing notes.  Pertinent labs & imaging results that were available during my care of the patient  were reviewed by me and considered in my medical decision making (see chart for details).      Pt is a 82 y.o.  female with *** days of *** shoulder pain after ***.   On exam, pt has tenderness at *** concerning for ***.     valso highly recommend that you look into starting Aqua Therapy to help with mobility and strength. We have also referred you to [facility] which can help with this.  1Obtained *** shoulder plain films.  Personally interpreted by me were ***unremarkable for fracture or dislocation. Radiologist report reviewed and additionally notes *** no soft tissue swelling.  Given ***Toradol IM/sling/brace/crutches  Patient to gradually return to normal activities, as tolerated and continue ordinary activities within the limits permitted by pain. Prescribed Naproxen sodium *** and muscle relaxer *** for pain relief.  Tylenol PRN. Advised patient to avoid OTC NSAIDs while taking prescription NSAID. Counseled patient on red flag symptoms and when to seek immediate care.  ***No red flags such as progressive major motor weakness.   Patient to follow up with orthopedic provider, if symptoms do not improve with conservative treatment.  Return and ED precautions given. Understanding voiced. Discussed MDM, treatment plan and plan for follow-up with patient/parent who agrees with plan.   Final Clinical Impressions(s) / UC Diagnoses   Final diagnoses:  None   Discharge Instructions   None    ED Prescriptions   None    PDMP not reviewed this encounter.

## 2023-01-12 NOTE — ED Triage Notes (Signed)
Patient reports pain and swelling in her left lower leg for a month.  Patient denies injury or fall.

## 2023-01-12 NOTE — Discharge Instructions (Addendum)
If medication was prescribed, stop by the pharmacy to pick up your prescriptions.  For your  pain, Take 1000 mg Tylenol and Gabapentin 100 mg three times a day,  as needed for pain. Rest and elevate the affected painful area.  Apply warm compresses intermittently, as needed.  As pain recedes, begin normal activities slowly as tolerated.  Follow up with  an orthopedic provider, if symptoms persist.  Watch for worsening symptoms such as an increasing weakness or loss of sensation, increasing pain and/or the loss of bladder or bowel function. Should any of these occur, go to the emergency department immediately.

## 2023-01-18 DIAGNOSIS — Z961 Presence of intraocular lens: Secondary | ICD-10-CM | POA: Diagnosis not present

## 2023-01-18 DIAGNOSIS — H5711 Ocular pain, right eye: Secondary | ICD-10-CM | POA: Diagnosis not present

## 2023-01-18 DIAGNOSIS — H20043 Secondary noninfectious iridocyclitis, bilateral: Secondary | ICD-10-CM | POA: Diagnosis not present

## 2023-04-08 DIAGNOSIS — H2013 Chronic iridocyclitis, bilateral: Secondary | ICD-10-CM | POA: Diagnosis not present

## 2023-04-08 DIAGNOSIS — Z0100 Encounter for examination of eyes and vision without abnormal findings: Secondary | ICD-10-CM | POA: Diagnosis not present

## 2023-04-08 DIAGNOSIS — H04123 Dry eye syndrome of bilateral lacrimal glands: Secondary | ICD-10-CM | POA: Diagnosis not present

## 2023-05-13 DIAGNOSIS — I10 Essential (primary) hypertension: Secondary | ICD-10-CM | POA: Diagnosis not present

## 2023-05-13 DIAGNOSIS — R7303 Prediabetes: Secondary | ICD-10-CM | POA: Diagnosis not present

## 2023-05-13 DIAGNOSIS — R8290 Unspecified abnormal findings in urine: Secondary | ICD-10-CM | POA: Diagnosis not present

## 2023-05-13 DIAGNOSIS — N1831 Chronic kidney disease, stage 3a: Secondary | ICD-10-CM | POA: Diagnosis not present

## 2023-05-13 DIAGNOSIS — Z0130 Encounter for examination of blood pressure without abnormal findings: Secondary | ICD-10-CM | POA: Diagnosis not present

## 2023-05-13 DIAGNOSIS — Z1389 Encounter for screening for other disorder: Secondary | ICD-10-CM | POA: Diagnosis not present

## 2023-05-13 DIAGNOSIS — E785 Hyperlipidemia, unspecified: Secondary | ICD-10-CM | POA: Diagnosis not present

## 2023-07-23 ENCOUNTER — Other Ambulatory Visit: Payer: Self-pay

## 2023-07-23 ENCOUNTER — Emergency Department

## 2023-07-23 ENCOUNTER — Emergency Department
Admission: EM | Admit: 2023-07-23 | Discharge: 2023-07-23 | Disposition: A | Discharge status: Home / Self Care | Attending: Emergency Medicine | Admitting: Emergency Medicine

## 2023-07-23 DIAGNOSIS — J810 Acute pulmonary edema: Secondary | ICD-10-CM | POA: Insufficient documentation

## 2023-07-23 DIAGNOSIS — I110 Hypertensive heart disease with heart failure: Secondary | ICD-10-CM | POA: Diagnosis not present

## 2023-07-23 DIAGNOSIS — R001 Bradycardia, unspecified: Secondary | ICD-10-CM | POA: Diagnosis not present

## 2023-07-23 DIAGNOSIS — I509 Heart failure, unspecified: Secondary | ICD-10-CM | POA: Diagnosis not present

## 2023-07-23 DIAGNOSIS — Z0130 Encounter for examination of blood pressure without abnormal findings: Secondary | ICD-10-CM | POA: Diagnosis not present

## 2023-07-23 DIAGNOSIS — R059 Cough, unspecified: Secondary | ICD-10-CM | POA: Diagnosis not present

## 2023-07-23 DIAGNOSIS — I700 Atherosclerosis of aorta: Secondary | ICD-10-CM | POA: Diagnosis not present

## 2023-07-23 DIAGNOSIS — R0989 Other specified symptoms and signs involving the circulatory and respiratory systems: Secondary | ICD-10-CM | POA: Diagnosis not present

## 2023-07-23 DIAGNOSIS — R300 Dysuria: Secondary | ICD-10-CM | POA: Diagnosis not present

## 2023-07-23 DIAGNOSIS — R0602 Shortness of breath: Secondary | ICD-10-CM

## 2023-07-23 LAB — BASIC METABOLIC PANEL WITH GFR
Anion gap: 11 (ref 5–15)
BUN: 14 mg/dL (ref 8–23)
CO2: 27 mmol/L (ref 22–32)
Calcium: 9.3 mg/dL (ref 8.9–10.3)
Chloride: 102 mmol/L (ref 98–111)
Creatinine, Ser: 1.12 mg/dL — ABNORMAL HIGH (ref 0.44–1.00)
GFR, Estimated: 49 mL/min — ABNORMAL LOW (ref >60)
Glucose, Bld: 127 mg/dL — ABNORMAL HIGH (ref 70–99)
Potassium: 3.5 mmol/L (ref 3.5–5.1)
Sodium: 140 mmol/L (ref 135–145)

## 2023-07-23 LAB — CBC
HCT: 38.5 % (ref 36.0–46.0)
Hemoglobin: 13.3 g/dL (ref 12.0–15.0)
MCH: 30.2 pg (ref 26.0–34.0)
MCHC: 34.5 g/dL (ref 30.0–36.0)
MCV: 87.3 fL (ref 80.0–100.0)
Platelets: 211 10*3/uL (ref 150–400)
RBC: 4.41 MIL/uL (ref 3.87–5.11)
RDW: 14.4 % (ref 11.5–15.5)
WBC: 5.2 10*3/uL (ref 4.0–10.5)
nRBC: 0 % (ref 0.0–0.2)

## 2023-07-23 LAB — HEPATIC FUNCTION PANEL
ALT: 16 U/L (ref 0–44)
AST: 21 U/L (ref 15–41)
Albumin: 3.4 g/dL — ABNORMAL LOW (ref 3.5–5.0)
Alkaline Phosphatase: 67 U/L (ref 38–126)
Bilirubin, Direct: 0.1 mg/dL (ref 0.0–0.2)
Indirect Bilirubin: 0.8 mg/dL (ref 0.3–0.9)
Total Bilirubin: 0.9 mg/dL (ref 0.0–1.2)
Total Protein: 7 g/dL (ref 6.5–8.1)

## 2023-07-23 LAB — TROPONIN I (HIGH SENSITIVITY)
Troponin I (High Sensitivity): 22 ng/L — ABNORMAL HIGH (ref <18)
Troponin I (High Sensitivity): 23 ng/L — ABNORMAL HIGH (ref <18)
Troponin I (High Sensitivity): 23 ng/L — ABNORMAL HIGH (ref <18)

## 2023-07-23 LAB — MAGNESIUM: Magnesium: 1.9 mg/dL (ref 1.7–2.4)

## 2023-07-23 LAB — LIPASE, BLOOD: Lipase: 30 U/L (ref 11–51)

## 2023-07-23 LAB — BRAIN NATRIURETIC PEPTIDE: B Natriuretic Peptide: 356.3 pg/mL — ABNORMAL HIGH (ref 0.0–100.0)

## 2023-07-23 MED ORDER — FUROSEMIDE 10 MG/ML IJ SOLN
40.0000 mg | Freq: Once | INTRAMUSCULAR | Status: AC
  Administered 2023-07-23: 40 mg via INTRAVENOUS
  Filled 2023-07-23: qty 4

## 2023-07-23 MED ORDER — FUROSEMIDE 20 MG PO TABS: 20.0000 mg | ORAL_TABLET | Freq: Every day | ORAL | 0 refills | Status: DC

## 2023-07-23 MED ORDER — FUROSEMIDE 20 MG PO TABS
20.0000 mg | ORAL_TABLET | Freq: Every day | ORAL | 0 refills | Status: DC
Start: 2023-07-23 — End: 2023-07-31

## 2023-07-23 NOTE — ED Provider Notes (Signed)
 [facility] Provider Note    Event Date/Time   First MD Initiated Contact with Patient 07/23/23 1506     (approximate)   History   Chief Complaint Shortness of Breath   HPI  Erin Goodman is a 83 y.o. female with past medical history of hypertension and gout who presents to the ED complaining of shortness of breath.  Patient reports that for the past 3 days she has been getting out of breath with any exertion.  She reports a dry cough but has not had any pain in her chest and denies any fevers.  She has not noticed any pain or swelling in her legs, denies any history of similar symptoms or cardiac history.  She initially presented to her PCPs office, was told that she has heart block and may need a pacemaker, subsequently referred to the ED.     Physical Exam   Triage Vital Signs: ED Triage Vitals [07/23/23 1336]  Encounter Vitals Group     BP 117/73     Systolic BP Percentile      Diastolic BP Percentile      Pulse Rate 100     Resp 20     Temp 98.7 F (37.1 C)     Temp Source Oral     SpO2 98 %     Weight 220 lb (99.8 kg)     Height 5\' 4"  (1.626 m)     Head Circumference      Peak Flow      Pain Score 2     Pain Loc      Pain Education      Exclude from Growth Chart     Most recent vital signs: Vitals:   07/23/23 1845 07/23/23 1918  BP: 106/70 117/73  Pulse: 96 99  Resp: (!) 21 18  Temp:  98.2 F (36.8 C)  SpO2: 98% 99%    Constitutional: Alert and oriented. Eyes: Conjunctivae are normal. Head: Atraumatic. Nose: No congestion/rhinnorhea. Mouth/Throat: Mucous membranes are moist.  Cardiovascular: Normal rate, regular rhythm. Grossly normal heart sounds.  2+ radial pulses bilaterally. Respiratory: Normal respiratory effort.  No retractions. Lungs CTAB. Gastrointestinal: Soft and nontender. No distention. Musculoskeletal: No lower extremity tenderness nor edema.  Neurologic:  Normal speech and language. No gross focal neurologic  deficits are appreciated.    ED Results / Procedures / Treatments   Labs (all labs ordered are listed, but only abnormal results are displayed) Labs Reviewed  BASIC METABOLIC PANEL WITH GFR - Abnormal; Notable for the following components:      Result Value   Glucose, Bld 127 (*)    Creatinine, Ser 1.12 (*)    GFR, Estimated 49 (*)    All other components within normal limits  HEPATIC FUNCTION PANEL - Abnormal; Notable for the following components:   Albumin 3.4 (*)    All other components within normal limits  BRAIN NATRIURETIC PEPTIDE - Abnormal; Notable for the following components:   B Natriuretic Peptide 356.3 (*)    All other components within normal limits  TROPONIN I (HIGH SENSITIVITY) - Abnormal; Notable for the following components:   Troponin I (High Sensitivity) 22 (*)    All other components within normal limits  TROPONIN I (HIGH SENSITIVITY) - Abnormal; Notable for the following components:   Troponin I (High Sensitivity) 23 (*)    All other components within normal limits  TROPONIN I (HIGH SENSITIVITY) - Abnormal; Notable for the following components:  Troponin I (High Sensitivity) 23 (*)    All other components within normal limits  CBC  MAGNESIUM  LIPASE, BLOOD     EKG  ED ECG REPORT I, Twilla Galea, the attending physician, personally viewed and interpreted this ECG.   Date: 07/23/2023  EKG Time: 13:35  Rate: 105  Rhythm: sinus tachycardia  Axis: Normal  Intervals:nonspecific intraventricular conduction delay, 1st degree heart block  ST&T Change: Inferior ST depressions  RADIOLOGY Chest x-ray reviewed and interpreted by me with pulmonary edema, no focal infiltrate or effusion noted.  PROCEDURES:  Critical Care performed: No  Procedures   MEDICATIONS ORDERED IN ED: Medications  furosemide (LASIX) injection 40 mg (40 mg Intravenous Given 07/23/23 1737)     IMPRESSION / MDM / ASSESSMENT AND PLAN / ED COURSE  I reviewed the triage  vital signs and the nursing notes.                              83 y.o. female with past medical history of hypertension and gout who presents to the ED complaining of dyspnea on exertion with dry cough for the past 3 days, denies chest pain or fever.  Patient's presentation is most consistent with acute presentation with potential threat to life or bodily function.  Differential diagnosis includes, but is not limited to, ACS, arrhythmia, PE, pneumonia, CHF, COPD, anemia, electrolyte abnormality, AKI.  Patient nontoxic-appearing and in no acute distress, vital signs are unremarkable.  EKG shows normal sinus rhythm with first-degree heart block, no evidence of more advanced heart block that would require pacemaker placement.  Chest x-ray and labs are pending at this time.  Labs without significant anemia, leukocytosis, electrolyte abnormality, or AKI.  Troponin mildly elevated but stable on recheck, BNP is mildly elevated and chest x-ray shows some mild pulmonary edema.  Suspect troponin elevation due to CHF and patient was given a dose of IV Lasix.  She has now diuresed and states she feels much better.  She was offered admission to the hospital, but declined and was able to ambulate without significant difficulty breathing.  We will write prescription for Lasix and refer her to follow-up with cardiology.  She was counseled to return to the ED for new or worsening symptoms.  Patient agrees with plan.      FINAL CLINICAL IMPRESSION(S) / ED DIAGNOSES   Final diagnoses:  Acute congestive heart failure, unspecified heart failure type (HCC)  Shortness of breath     Rx / DC Orders   ED Discharge Orders          Ordered    furosemide (LASIX) 20 MG tablet  Daily        07/23/23 1924    AMB referral to CHF clinic        07/23/23 1925             Note:  This document was prepared using Dragon voice recognition software and may include unintentional dictation errors.   Twilla Galea, MD 07/23/23 [phone]

## 2023-07-23 NOTE — ED Notes (Signed)
 SpO2 while ambulating maintained above 95% on RA

## 2023-07-23 NOTE — ED Triage Notes (Signed)
 Pt to ED via POV from home. Pt reports epigastric burning pain, SOB and feeling of slow HR x2-3 days. Pt went to the clinic this morning and EKG showed SB and PCP reporting needing possible pacemaker.

## 2023-07-30 ENCOUNTER — Telehealth: Payer: Self-pay | Admitting: Internal Medicine

## 2023-07-30 NOTE — Telephone Encounter (Signed)
 Called to confirm/remind patient of their appointment at the [facility] on 07/31/23.   Appointment:   [x] Confirmed  [] Left mess   [] No answer/No voice mail  [] VM Full/unable to leave message  [] Phone not in service  Patient reminded to bring all medications and/or complete list.  Confirmed patient has transportation. Gave directions, instructed to utilize valet parking.

## 2023-07-31 ENCOUNTER — Encounter: Payer: Self-pay | Admitting: Internal Medicine

## 2023-07-31 ENCOUNTER — Ambulatory Visit: Attending: Internal Medicine | Admitting: Internal Medicine

## 2023-07-31 VITALS — BP 128/70 | HR 90 | Wt 220.0 lb

## 2023-07-31 DIAGNOSIS — I110 Hypertensive heart disease with heart failure: Secondary | ICD-10-CM | POA: Diagnosis not present

## 2023-07-31 DIAGNOSIS — Z79899 Other long term (current) drug therapy: Secondary | ICD-10-CM | POA: Insufficient documentation

## 2023-07-31 DIAGNOSIS — Z7984 Long term (current) use of oral hypoglycemic drugs: Secondary | ICD-10-CM | POA: Diagnosis not present

## 2023-07-31 DIAGNOSIS — I10 Essential (primary) hypertension: Secondary | ICD-10-CM | POA: Diagnosis not present

## 2023-07-31 DIAGNOSIS — I5031 Acute diastolic (congestive) heart failure: Secondary | ICD-10-CM

## 2023-07-31 DIAGNOSIS — M109 Gout, unspecified: Secondary | ICD-10-CM | POA: Diagnosis not present

## 2023-07-31 DIAGNOSIS — I440 Atrioventricular block, first degree: Secondary | ICD-10-CM | POA: Diagnosis not present

## 2023-07-31 DIAGNOSIS — E669 Obesity, unspecified: Secondary | ICD-10-CM | POA: Insufficient documentation

## 2023-07-31 DIAGNOSIS — R0602 Shortness of breath: Secondary | ICD-10-CM | POA: Diagnosis not present

## 2023-07-31 MED ORDER — JARDIANCE 10 MG PO TABS: 10.0000 mg | ORAL_TABLET | Freq: Every day | ORAL | 3 refills | Status: DC

## 2023-07-31 MED ORDER — FUROSEMIDE 20 MG PO TABS: 20.0000 mg | ORAL_TABLET | Freq: Every day | ORAL | 11 refills | Status: DC | PRN

## 2023-07-31 NOTE — Patient Instructions (Addendum)
 Medication Changes:  START JARDIANCE 10 MG ONCE DAILY  TAKE LASIX  20 MG AS NEEDED ONLY FOR WEIGHT GAIN OR SWELLING   Lab Work:  Go over to the [facility]ICAL MALL. Go pass the gift shop and have your blood work completed.  We will only call you if the results are abnormal or if the provider would like to make medication changes.   Testing/Procedures:  Please have your echo completed. You will check in for this at the [facility]. You have to arrive 15 MINS EARLY for preparation, otherwise you will have to reschedule. Felipa Horsfall is gone for the day. So you can give us  a call tomorrow to get this scheduled. Or we will call you between tomorrow or sometime next week  Genetic testing has ordered collected. We do not have any test currently in stock. We will call you to come back for testing once we receive more tests.  Your provider has recommended that  you wear a Zio Patch for 14 days.  This monitor will record your heart rhythm for our review.  IF you have any symptoms while wearing the monitor please press the button.  If you have any issues with the patch or you notice a red or orange light on it please call the company at [phone].  Once you remove the patch please mail it back to the company as soon as possible so we can get the results.    Follow-Up in: 1-2 MONTHS WITH DR. Julane Ny  At the [facility], you and your health needs are our priority. We have a designated team specialized in the treatment of Heart Failure. This Care Team includes your primary Heart Failure Specialized Cardiologist (physician), Advanced Practice Providers (APPs- Physician Assistants and Nurse Practitioners), and Pharmacist who all work together to provide you with the care you need, when you need it.   You may see any of the following providers on your designated Care Team at your next follow up:  Dr. Jules Oar Dr. Peder Bourdon Dr. Alwin Baars Dr. Judyth Nunnery Shawnee Dellen,  FNP Bevely Brush, RPH-CPP  Please be sure to bring in all your medications bottles to every appointment.   Need to Contact Us :  If you have any questions or concerns before your next appointment please send us  a message through [employer] or call our office at [phone].    TO LEAVE A MESSAGE FOR THE NURSE SELECT OPTION 2, PLEASE LEAVE A MESSAGE INCLUDING: YOUR NAME DATE OF BIRTH CALL BACK NUMBER REASON FOR CALL**this is important as we prioritize the call backs  YOU WILL RECEIVE A CALL BACK THE SAME DAY AS LONG AS YOU CALL BEFORE 4:00 PM

## 2023-07-31 NOTE — Addendum Note (Signed)
 Addended by: Jaymon Dudek A on: 07/31/2023 04:48 PM   Modules accepted: Orders

## 2023-07-31 NOTE — Progress Notes (Signed)
 ADVANCED HF CLINIC CONSULT NOTE  Referring Physician: [facility] ED Primary Care: [facility] Primary Cardiologist: None  Chief Complaint: Shortness of breath/HF  HPI:  [name] is an 83 y/o woman with h/o HTN, obesity and gout referred by the [facility] ED for further evaluation of dyspnea and HF.  Denies any known cardiac history. Seen in ED on 07/23/23 with several day h/o SOB. Initially seen by PCP and sent to ED for possible heart block. ECG read as sinus tach with marked 1AVB, hstrop and CXR ok. BNP elevated at 356. Given a dose of IV lasix  and referred here.   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 Has been taking lasix  20mg  daily for 7 days and feels much better. No longer swelling. No SOB, orthopnea or PND. No CP. Says BP well controlled 120s. No syncope or presyncope. Denies parasthesias   Mother died of HF at 60 Maternal nephew with HF in 62s   Past Medical History:  Diagnosis Date   Gout    Hypertension     Current Outpatient Medications  Medication Sig Dispense Refill   allopurinol (ZYLOPRIM) 100 MG tablet Take 100 mg by mouth daily.     Ascorbic Acid (VITAMIN C) 1000 MG tablet Take 1,000 mg by mouth daily.     cholecalciferol (VITAMIN D) 25 MCG (1000 UNIT) tablet Take 1,000 Units by mouth daily.     diphenhydrAMINE (BENADRYL) 25 mg capsule Take 25 mg by mouth every 6 (six) hours as needed.     furosemide  (LASIX ) 20 MG tablet Take 1 tablet (20 mg total) by mouth daily for 7 days. 7 tablet 0   Red Yeast Rice 600 MG CAPS Take by mouth.     triamterene-hydrochlorothiazide (MAXZIDE-25) 37.5-25 MG tablet Take 1 tablet by mouth daily.     Turmeric 500 MG CAPS Take by mouth.     No current facility-administered medications for this visit.    Allergies  Allergen Reactions   Penicillins    Shellfish Allergy Itching      Social History   Socioeconomic History   Marital status: Married    Spouse name: Not on file    Number of children: Not on file   Years of education: Not on file   Highest education level: Not on file  Occupational History   Not on file  Tobacco Use   Smoking status: Former   Smokeless tobacco: Never  Vaping Use   Vaping status: Never Used  Substance and Sexual Activity   Alcohol use: Yes    Comment: ocassionaly    Drug use: No   Sexual activity: Not on file  Other Topics Concern   Not on file  Social History Narrative   Not on file   Social Drivers of Health   Financial Resource Strain: Not on file  Food Insecurity: Not on file  Transportation Needs: Not on file  Physical Activity: Not on file  Stress: Not on file  Social Connections: Not on file  Intimate Partner Violence: Not on file      Family History  Problem Relation Age of Onset   Breast cancer Neg Hx     Vitals:   07/31/23 1454  BP: 128/70  Pulse: 90  SpO2: 98%  Weight: 220 lb (99.8 kg)    PHYSICAL EXAM: General:  Well appearing. No respiratory difficulty HEENT: normal Neck: supple. no JVD. Carotids 2+ bilat; + bruits. No lymphadenopathy or  thryomegaly appreciated. Cor: PMI nondisplaced. Regular rate & rhythm. 2/6 AS s2 ok Lungs: clear Abdomen: soft, nontender, nondistended. No hepatosplenomegaly. No bruits or masses. Good bowel sounds. Extremities: no cyanosis, clubbing, rash, tr edema Neuro: alert & oriented x 3, cranial nerves grossly intact. moves all 4 extremities w/o difficulty. Affect pleasant.  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 AoV thickened. Mod-severe MAC  ASSESSMENT & PLAN:  1. Acute diastolic HF - POCUS echo today 07/31/23 EF 60-65% (likely mild LVH). AoV thickened. Mod-severe MAC (done personally) - Volume status much improved with addition of lasix  20 daily by ER - Will start  Jardiance 10 daily and switch lasix  to PRN - Continue Maxide for now. Can consider switch to or addition of spiro down there road - Will get formal echo - Given LVH, 1AVB and valvular disease concern for possible cardiac amyloid (though echo did not appear classic for it). Will check myeloma panel and TTR genetics. May need cMRI depending on results of formal echo  2. 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 HTN - well controlled  Jules Oar, MD  4:40 PM

## 2023-08-06 ENCOUNTER — Telehealth: Payer: Self-pay | Admitting: Cardiology

## 2023-08-06 NOTE — Telephone Encounter (Signed)
 Called to confirm/remind patient of their appointment at the [facility] on 08/07/23.   Appointment:   [] Confirmed  [x] Left mess   [] No answer/No voice mail  [] VM Full/unable to leave message  [] Phone not in service  Patient reminded to bring all medications and/or complete list.  Confirmed patient has transportation. Gave directions, instructed to utilize valet parking.

## 2023-08-07 ENCOUNTER — Encounter: Admitting: Cardiology

## 2023-08-08 ENCOUNTER — Telehealth: Payer: Self-pay

## 2023-08-08 ENCOUNTER — Other Ambulatory Visit
Admission: RE | Admit: 2023-08-08 | Discharge: 2023-08-08 | Disposition: A | Discharge status: Home / Self Care | Attending: Internal Medicine | Admitting: Internal Medicine

## 2023-08-08 ENCOUNTER — Other Ambulatory Visit (HOSPITAL_COMMUNITY): Payer: Self-pay

## 2023-08-08 DIAGNOSIS — I5032 Chronic diastolic (congestive) heart failure: Secondary | ICD-10-CM | POA: Insufficient documentation

## 2023-08-08 LAB — BASIC METABOLIC PANEL WITH GFR
Anion gap: 11 (ref 5–15)
BUN: 14 mg/dL (ref 8–23)
CO2: 27 mmol/L (ref 22–32)
Calcium: 9.4 mg/dL (ref 8.9–10.3)
Chloride: 101 mmol/L (ref 98–111)
Creatinine, Ser: 0.9 mg/dL (ref 0.44–1.00)
GFR, Estimated: >60 mL/min (ref >60)
Glucose, Bld: 104 mg/dL — ABNORMAL HIGH (ref 70–99)
Potassium: 3.5 mmol/L (ref 3.5–5.1)
Sodium: 139 mmol/L (ref 135–145)

## 2023-08-08 MED ORDER — JARDIANCE 10 MG PO TABS: 10.0000 mg | ORAL_TABLET | Freq: Every day | ORAL | Status: AC

## 2023-08-08 NOTE — Telephone Encounter (Signed)
 Pt arrived to clinic to get genetics testing. While speaking to pt she was agreeable to lab work follow up. Also pt stated that she had not filled the jardiance  because it was going to be $400. Gave pt samples and advised that I would notify the pharmacist to see if she has any other options.

## 2023-08-08 NOTE — Telephone Encounter (Signed)
 Advanced Heart Failure Patient Advocate Encounter  This patient may be eligible for a grant that would cover the cost of Jardiance . I have been unable to contact patient to confirm income and other information as well as consent to apply. Left message, will continue to try to contact patient.  Kennis Peacock, CPhT Rx Patient Advocate Phone: [phone]

## 2023-08-11 NOTE — Telephone Encounter (Signed)
 Patient advocate called on Friday to evaluate eligibility for [employer] grant. Attempted to call patient's home and cell phone today with no response.

## 2023-08-14 NOTE — Telephone Encounter (Signed)
 Second attempt to contact patient regarding patient assistance. No answer, left voicemail

## 2023-08-15 ENCOUNTER — Telehealth: Payer: Self-pay

## 2023-08-15 ENCOUNTER — Other Ambulatory Visit (HOSPITAL_COMMUNITY): Payer: Self-pay

## 2023-08-15 NOTE — Telephone Encounter (Signed)
 Grant obtained in separate encounter.

## 2023-08-15 NOTE — Telephone Encounter (Signed)
 Advanced Heart Failure Patient Advocate Encounter  The patient was approved for a [employer] grant that will help cover the cost of Jardiance .  Total amount awarded, $4,500.  Effective: 07/16/2023 - 07/14/2024.  BIN [id number] PCN [id number] [id number] ID [id number]  Pharmacy provided with approval and processing information. Patient informed via phone.  Kennis Peacock, CPhT Rx Patient Advocate Phone: [phone]

## 2023-08-26 ENCOUNTER — Other Ambulatory Visit (HOSPITAL_COMMUNITY): Payer: Self-pay | Admitting: Internal Medicine

## 2023-08-26 ENCOUNTER — Inpatient Hospital Stay (HOSPITAL_COMMUNITY)
Admission: RE | Admit: 2023-08-26 | Discharge: 2023-08-26 | Disposition: A | Discharge status: Home / Self Care | Source: Ambulatory Visit | Attending: Internal Medicine | Admitting: Internal Medicine

## 2023-08-26 DIAGNOSIS — I4892 Unspecified atrial flutter: Secondary | ICD-10-CM

## 2023-09-03 DIAGNOSIS — I4892 Unspecified atrial flutter: Secondary | ICD-10-CM | POA: Diagnosis not present

## 2023-09-17 DIAGNOSIS — Z87891 Personal history of nicotine dependence: Secondary | ICD-10-CM | POA: Diagnosis not present

## 2023-09-17 DIAGNOSIS — I509 Heart failure, unspecified: Secondary | ICD-10-CM | POA: Diagnosis not present

## 2023-09-17 DIAGNOSIS — Z880 Allergy status to penicillin: Secondary | ICD-10-CM | POA: Diagnosis not present

## 2023-09-17 DIAGNOSIS — I130 Hypertensive heart and chronic kidney disease with heart failure and stage 1 through stage 4 chronic kidney disease, or unspecified chronic kidney disease: Secondary | ICD-10-CM | POA: Diagnosis not present

## 2023-09-17 DIAGNOSIS — N1831 Chronic kidney disease, stage 3a: Secondary | ICD-10-CM | POA: Diagnosis not present

## 2023-09-17 DIAGNOSIS — M1990 Unspecified osteoarthritis, unspecified site: Secondary | ICD-10-CM | POA: Diagnosis not present

## 2023-09-17 DIAGNOSIS — M109 Gout, unspecified: Secondary | ICD-10-CM | POA: Diagnosis not present

## 2023-09-17 DIAGNOSIS — Z8249 Family history of ischemic heart disease and other diseases of the circulatory system: Secondary | ICD-10-CM | POA: Diagnosis not present

## 2023-09-17 DIAGNOSIS — I700 Atherosclerosis of aorta: Secondary | ICD-10-CM | POA: Diagnosis not present

## 2023-09-17 DIAGNOSIS — R32 Unspecified urinary incontinence: Secondary | ICD-10-CM | POA: Diagnosis not present

## 2023-09-17 DIAGNOSIS — Z833 Family history of diabetes mellitus: Secondary | ICD-10-CM | POA: Diagnosis not present

## 2023-10-09 ENCOUNTER — Ambulatory Visit: Attending: Internal Medicine

## 2023-10-21 ENCOUNTER — Telehealth: Payer: Self-pay | Admitting: Family

## 2023-10-21 NOTE — Telephone Encounter (Signed)
 Called to confirm/remind patient of their appointment at the [facility] on 10/22/23.   Appointment:   [x] Confirmed  [] Left mess   [] No answer/No voice mail  [] VM Full/unable to leave message  [] Phone not in service  Patient reminded to bring all medications and/or complete list.  Confirmed patient has transportation. Gave directions, instructed to utilize valet parking.

## 2023-10-22 ENCOUNTER — Ambulatory Visit: Attending: Internal Medicine | Admitting: Internal Medicine

## 2023-10-22 ENCOUNTER — Encounter: Payer: Self-pay | Admitting: Internal Medicine

## 2023-10-22 VITALS — BP 131/84 | HR 81 | Wt 222.8 lb

## 2023-10-22 DIAGNOSIS — Z8249 Family history of ischemic heart disease and other diseases of the circulatory system: Secondary | ICD-10-CM | POA: Diagnosis not present

## 2023-10-22 DIAGNOSIS — M109 Gout, unspecified: Secondary | ICD-10-CM | POA: Insufficient documentation

## 2023-10-22 DIAGNOSIS — Z87891 Personal history of nicotine dependence: Secondary | ICD-10-CM | POA: Diagnosis not present

## 2023-10-22 DIAGNOSIS — E669 Obesity, unspecified: Secondary | ICD-10-CM | POA: Diagnosis not present

## 2023-10-22 DIAGNOSIS — R600 Localized edema: Secondary | ICD-10-CM | POA: Insufficient documentation

## 2023-10-22 DIAGNOSIS — I110 Hypertensive heart disease with heart failure: Secondary | ICD-10-CM | POA: Insufficient documentation

## 2023-10-22 DIAGNOSIS — I5032 Chronic diastolic (congestive) heart failure: Secondary | ICD-10-CM | POA: Insufficient documentation

## 2023-10-22 DIAGNOSIS — Z79899 Other long term (current) drug therapy: Secondary | ICD-10-CM | POA: Insufficient documentation

## 2023-10-22 DIAGNOSIS — I493 Ventricular premature depolarization: Secondary | ICD-10-CM | POA: Insufficient documentation

## 2023-10-22 DIAGNOSIS — I440 Atrioventricular block, first degree: Secondary | ICD-10-CM | POA: Diagnosis not present

## 2023-10-22 MED ORDER — FUROSEMIDE 20 MG PO TABS: 20.0000 mg | ORAL_TABLET | ORAL | 11 refills | Status: DC

## 2023-10-22 MED ORDER — POTASSIUM CHLORIDE CRYS ER 20 MEQ PO TBCR: 20.0000 meq | EXTENDED_RELEASE_TABLET | ORAL | 4 refills | Status: DC

## 2023-10-22 MED ORDER — FUROSEMIDE 20 MG PO TABS: 20.0000 mg | ORAL_TABLET | Freq: Every day | ORAL | 11 refills | Status: AC | PRN

## 2023-10-22 MED ORDER — POTASSIUM CHLORIDE CRYS ER 20 MEQ PO TBCR: 20.0000 meq | EXTENDED_RELEASE_TABLET | Freq: Every day | ORAL | 4 refills | Status: DC | PRN

## 2023-10-22 NOTE — Progress Notes (Signed)
 ReDS Vest / Clip - 10/22/23 1000       ReDS Vest / Clip   Station Marker B    Ruler Value 45    ReDS Value Range Low volume    ReDS Actual Value 24

## 2023-10-22 NOTE — Progress Notes (Signed)
 ADVANCED HF CLINIC CONSULT NOTE  Referring Physician: [facility] ED Primary Care: Pcp, No Primary Cardiologist: None  Chief Complaint: Heart Failure  HPI: Erin Goodman is an 83 y/o woman with h/o HTN, obesity, HFpEF, and gout.    Seen in ED on 07/23/23 with several day h/o SOB. Initially seen by PCP and sent to ED for possible heart block. ECG read as sinus tach with marked 1AVB, hstrop and CXR ok. BNP elevated at 356. Given a dose of IV lasix .  (Dr Bensimhons  iew of ECG - likely AFL) - Zio Monitor SR 1st degree heart block. No A fib/Flutter occasional PVCs.   Genetic Test- Negative   Today she returns for HF follow up.Overall feeling fine. Stopped taking jardiance  due to rash/itching. She stopped and symptoms seems to be resolving. She does not increase lower extremity swelling  after stopping jardiance .   Denies PND/Orthopnea. Appetite ok. No fever or chills. She does not weigh at home.  Taking all medications.  Mother died of HF at 11 Maternal nephew with HF in 86s   Past Medical History:  Diagnosis Date   Gout    Hypertension     Current Outpatient Medications  Medication Sig Dispense Refill   allopurinol (ZYLOPRIM) 100 MG tablet Take 100 mg by mouth daily.     Ascorbic Acid (VITAMIN C) 1000 MG tablet Take 1,000 mg by mouth daily.     cholecalciferol (VITAMIN D) 25 MCG (1000 UNIT) tablet Take 1,000 Units by mouth daily.     diphenhydrAMINE (BENADRYL) 25 mg capsule Take 25 mg by mouth every 6 (six) hours as needed.     furosemide  (LASIX ) 20 MG tablet Take 1 tablet (20 mg total) by mouth daily as needed. For weight gain or swelling 30 tablet 11   Red Yeast Rice 600 MG CAPS Take by mouth.     triamterene-hydrochlorothiazide (MAXZIDE-25) 37.5-25 MG tablet Take 1 tablet by mouth daily.     Turmeric 500 MG CAPS Take by mouth.     JARDIANCE  10 MG TABS tablet Take 1 tablet (10 mg total) by mouth daily before breakfast. (Patient not taking: Reported on 10/22/2023)     No current  facility-administered medications for this visit.    Allergies  Allergen Reactions   Penicillins    Shellfish Allergy Itching      Social History   Socioeconomic History   Marital status: Married    Spouse name: Not on file   Number of children: Not on file   Years of education: Not on file   Highest education level: Not on file  Occupational History   Not on file  Tobacco Use   Smoking status: Former   Smokeless tobacco: Never  Vaping Use   Vaping status: Never Used  Substance and Sexual Activity   Alcohol use: Yes    Comment: ocassionaly    Drug use: No   Sexual activity: Not on file  Other Topics Concern   Not on file  Social History Narrative   Not on file   Social Drivers of Health   Financial Resource Strain: Not on file  Food Insecurity: Not on file  Transportation Needs: Not on file  Physical Activity: Not on file  Stress: Not on file  Social Connections: Not on file  Intimate Partner Violence: Not on file      Family History  Problem Relation Age of Onset   Breast cancer Neg Hx     Vitals:   10/22/23 1009  BP: 131/84  Pulse: 81  SpO2: 97%  Weight: 222 lb 12.8 oz (101.1 kg)   Wt Readings from Last 3 Encounters:  10/22/23 222 lb 12.8 oz (101.1 kg)  07/31/23 220 lb (99.8 kg)  07/23/23 220 lb (99.8 kg)    PHYSICAL EXAM: ASSESSMENT & PLAN: General:   No resp difficulty Neck: JVP 7-8  Cor: Regular rate & rhythm. RUSB 2/6  Lungs: clear Abdomen: soft, nontender, nondistended.  Extremities: R and LLE 1+ edeam. L>R Neuro: alert & oriented x3   1. Chronic HFpEF - POCUS echo 07/31/23 EF 60-65% (likely mild LVH). AoV thickened. Mod-severe MAC (done personally) -NYHA II Volume status difficult to assess. Obtained ReDs reading.  ReDs reading: 24 %, abnormal. Suspect lower extremity edema is related to venous insufficiency. Can stay off jardiance  due to rash.  - Continue lasix  20 mg added. Instructed to take 20 meq KDUR if she takes lasix .    Continue Maxide for now.  - Set up ECHO. - TTR Genetic test negative. Discussed.   2. 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 No A Fib. Average heart rate 85. 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   4. HTN - Stable.   5. Lower Extremity Edema  Suspect venous insufficiency Use Ted Hose  Set up LLE venous doppler    Follow up in 2 months in HF clinic.  Erin Mosses, NP  10:17 AM  Patient seen and examined with the above-signed Advanced Practice Provider and/or Housestaff. I personally reviewed laboratory data, imaging studies and relevant notes. I independently examined the patient and formulated the important aspects of the plan. I have edited the note to reflect any of my changes or salient points. I have personally discussed the plan with the patient and/or family.  Overall doing well but has noticed more LE swelling. Just mild exertional dyspnea but not very active.   Unable to tolerate Jardiance  due to itching.   Has not had formal echo yet.   Genetic testing for TTR is negative.  Zio 1AVB with avg HR 57. No pauses  Personally reviewed  General:  Elderly No resp difficulty HEENT: normal Neck: supple. JVP 7. Carotids 2+ bilat; no bruits. No lymphadenopathy or thryomegaly appreciated. Cor: PMI nondisplaced. Regular rate & rhythm. No rubs, gallops or murmurs. Lungs: clear Abdomen: soft, nontender, nondistended. No hepatosplenomegaly. No bruits or masses. Good bowel sounds. Extremities: no cyanosis, clubbing, rash, 1-2 ankle edema Neuro: alert & orientedx3, cranial nerves grossly intact. moves all 4 extremities w/o difficulty. Affect pleasant  ReDS 24%   She does have mild bilateral LE edema but low ReDS reading suggest relative volume depletion and that edema more likely venous insufficiency. Have suggested LE compression hose. Can take lasix  as needed. Needs echo.    Toribio Fuel, MD  11:18 AM

## 2023-10-22 NOTE — Patient Instructions (Signed)
 Medication Changes:  INCREASE Furosemide  to 20mg  (1 tab) every Monday, Wednesday, and Friday.  START Potassium 20meq ( 1 tab) every Monday, Wednesday, and Friday with Furosemide .   Testing/Procedures:  Your physician has requested that you have an echocardiogram. Echocardiography is a painless test that uses sound waves to create images of your heart. It provides your doctor with information about the size and shape of your heart and how well your heart's chambers and valves are working. This procedure takes approximately one hour. There are no restrictions for this procedure. Please do NOT wear cologne, perfume, aftershave, or lotions (deodorant is allowed). Please arrive 15 minutes prior to your appointment time.  Please note: We ask at that you not bring children with you during ultrasound (echo/ vascular) testing. Due to room size and safety concerns, children are not allowed in the ultrasound rooms during exams. Our front office staff cannot provide observation of children in our lobby area while testing is being conducted. An adult accompanying a patient to their appointment will only be allowed in the ultrasound room at the discretion of the ultrasound technician under special circumstances. We apologize for any inconvenience.  Someone will contact you in order to schedule your appointment. This will be done in [facility].   Special Instructions // Education:  Your physician has requested that you have a lower or upper extremity venous duplex. This test is an ultrasound of the veins in the legs or arms. It looks at venous blood flow that carries blood from the heart to the legs or arms. Allow one hour for a Lower Venous exam. Allow thirty minutes for an Upper Venous exam. There are no restrictions or special instructions.  Please note: We ask at that you not bring children with you during ultrasound (echo/ vascular) testing. Due to room size and safety concerns, children are not  allowed in the ultrasound rooms during exams. Our front office staff cannot provide observation of children in our lobby area while testing is being conducted. An adult accompanying a patient to their appointment will only be allowed in the ultrasound room at the discretion of the ultrasound technician under special circumstances. We apologize for any inconvenience.    [facility] will be calling you in order to get you scheduled.   Please wear compression socks daily and remove at bedtime.   Follow-Up in: Please follow up with the [facility] in 2 months with Ellouise Class, FNP.    Thank you for choosing [facility].    At the [facility], you and your health needs are our priority. We have a designated team specialized in the treatment of Heart Failure. This Care Team includes your primary Heart Failure Specialized Cardiologist (physician), Advanced Practice Providers (APPs- Physician Assistants and Nurse Practitioners), and Pharmacist who all work together to provide you with the care you need, when you need it.   You may see any of the following providers on your designated Care Team at your next follow up:  Dr. Toribio Fuel Dr. Ezra Shuck Dr. Ria Commander Dr. Morene Brownie Ellouise Class, FNP Jaun Bash, RPH-CPP  Please be sure to bring in all your medications bottles to every appointment.   Need to Contact Us :  If you have any questions or concerns before your next appointment please send us  a message through [place] or call our office at [phone].    TO LEAVE A MESSAGE FOR THE NURSE SELECT OPTION 2,  PLEASE LEAVE A MESSAGE INCLUDING: YOUR NAME DATE OF BIRTH CALL BACK NUMBER REASON FOR CALL**this is important as we prioritize the call backs  YOU WILL RECEIVE A CALL BACK THE SAME DAY AS LONG AS YOU CALL BEFORE 4:00 PM

## 2023-10-24 ENCOUNTER — Ambulatory Visit (INDEPENDENT_AMBULATORY_CARE_PROVIDER_SITE_OTHER)

## 2023-10-24 ENCOUNTER — Other Ambulatory Visit (INDEPENDENT_AMBULATORY_CARE_PROVIDER_SITE_OTHER): Payer: Self-pay | Admitting: Vascular Surgery

## 2023-10-24 DIAGNOSIS — R600 Localized edema: Secondary | ICD-10-CM

## 2023-12-23 ENCOUNTER — Telehealth: Payer: Self-pay | Admitting: Family

## 2023-12-23 NOTE — Telephone Encounter (Signed)
 Called to confirm/remind patient of their appointment at the [facility] on 12/24/23.   Appointment:   [x] Confirmed  [] Left mess   [] No answer/No voice mail  [] VM Full/unable to leave message  [] Phone not in service  Patient reminded to bring all medications and/or complete list.  Confirmed patient has transportation. Gave directions, instructed to utilize valet parking.

## 2023-12-23 NOTE — Progress Notes (Unsigned)
 ADVANCED HF CLINIC CONSULT NOTE  Referring Physician: [facility] ED Primary Care: Pcp, No Primary Cardiologist: None  Chief Complaint: Heart Failure  HPI: Erin Goodman is an 83 y/o woman with h/o HTN, obesity, HFpEF, and gout.   Seen in ED on 07/23/23 with several day h/o SOB. Initially seen by PCP and sent to ED for possible heart block. ECG read as sinus tach with marked 1AVB, hstrop and CXR ok. BNP elevated at 356. Given a dose of IV lasix .  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 No A fib/Flutter occasional PVCs.   Genetic Test- Negative   US  10/24/23 to rule out DVT due to lower leg edema. US  negative.   She presents today in clinic, accompanied by her husband, for a follow up heart failure visit. Overall expresses no acute concerns. Denies shortness of breath, chest pain, dizziness, fatigue, cough, edema, abdominal distention, orthopnea, and PND   She started back taking jardiance  as of Saturday with no associated complications. Previously stopped due to concerns that jardiance  was causing rash/itching. She is still experiencing mild itching but has started using vaseline which has alleviated symptoms, itching primarily in lower abdomen and groin area. Itching continued despite discontinuing jardiance . Denies UTI symptoms. Additionally, she has only taken 1-2 doses of lasix  since her last visit in 8/25. States that she is supposed to take with potassium but is unable to swallow the pills because they are to big and can't be crushed. She is taking maxzide and gout meds daily.   Has reduced salt intake, uses himilayan salt. Cooks mainly at home. No weighing at home but states she has lost 30lbs in the last ~ 1.5 years. Denies smoking, alcohol, and drug use.   Upcoming echo 01/28/2024.   Mother died of HF at 13 Maternal nephew with HF in 44s   Past Medical History:  Diagnosis Date   Gout    Hypertension     Current Outpatient Medications   Medication Sig Dispense Refill   allopurinol (ZYLOPRIM) 100 MG tablet Take 100 mg by mouth daily.     Ascorbic Acid (VITAMIN C) 1000 MG tablet Take 1,000 mg by mouth daily.     cholecalciferol (VITAMIN D) 25 MCG (1000 UNIT) tablet Take 1,000 Units by mouth daily.     diphenhydrAMINE (BENADRYL) 25 mg capsule Take 25 mg by mouth every 6 (six) hours as needed.     furosemide  (LASIX ) 20 MG tablet Take 1 tablet (20 mg total) by mouth daily as needed. For weight gain or swelling 30 tablet 11   JARDIANCE  10 MG TABS tablet Take 1 tablet (10 mg total) by mouth daily before breakfast. (Patient not taking: Reported on 10/22/2023)     potassium chloride  SA (KLOR-CON  M) 20 MEQ tablet Take 1 tablet (20 mEq total) by mouth daily as needed. Take with furosemide  30 tablet 4   Red Yeast Rice 600 MG CAPS Take by mouth.     triamterene-hydrochlorothiazide (MAXZIDE-25) 37.5-25 MG tablet Take 1 tablet by mouth daily.     Turmeric 500 MG CAPS Take by mouth.     No current facility-administered medications for this visit.    Allergies  Allergen Reactions   Penicillins    Shellfish Allergy Itching      Social History   Socioeconomic History   Marital status: Married    Spouse name: Not on file   Number of children: Not on file   Years of education:  Not on file   Highest education level: Not on file  Occupational History   Not on file  Tobacco Use   Smoking status: Former   Smokeless tobacco: Never  Vaping Use   Vaping status: Never Used  Substance and Sexual Activity   Alcohol use: Yes    Comment: ocassionaly    Drug use: No   Sexual activity: Not on file  Other Topics Concern   Not on file  Social History Narrative   Not on file   Social Drivers of Health   Financial Resource Strain: Not on file  Food Insecurity: Not on file  Transportation Needs: Not on file  Physical Activity: Not on file  Stress: Not on file  Social Connections: Not on file  Intimate Partner Violence: Not on file       Family History  Problem Relation Age of Onset   Breast cancer Neg Hx     Vitals:   12/24/23 1011  BP: 135/70  Pulse: 89  SpO2: 100%  Weight: 99.3 kg   Wt Readings from Last 3 Encounters:  12/24/23 99.3 kg  10/22/23 101.1 kg  07/31/23 99.8 kg    PHYSICAL EXAM:  General: Well appearing. No resp difficulty.  Neck: No JVD.  Cor: Regular rate & rhythm. RUSB 2/6  Lungs: Clear bilaterally. Symmetrical chest expansion. No cough.  Abdomen: Soft, nontender, nondistended.  Extremities: No edema, cyanosis, or rashes.  Neuro: AO x 3. Affect pleasant.   ASSESSMENT & PLAN:  1. Chronic HFpEF - POCUS echo 07/31/23 EF 60-65% (likely mild LVH). AoV thickened. Mod-severe MAC  - NYHA II - euvolemic  - weight down 3lbs since last visit 2 months ago  - encouraged to weigh daily  - encouraged sodium restriction <2g daily  - TTR Genetic test negative. Previously discussed.  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 No A Fib. Average heart rate 85. Occasinal PVCs - discussed likely need for PPM down the road  3. 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   Follow up in December post echo in HF clinic.   Ellouise Sprout, FNP / Solomon Blumenthal, FNP-S 12/24/23

## 2023-12-24 ENCOUNTER — Ambulatory Visit: Attending: Family | Admitting: Family

## 2023-12-24 ENCOUNTER — Encounter: Payer: Self-pay | Admitting: Family

## 2023-12-24 VITALS — BP 135/70 | HR 89 | Wt 219.0 lb

## 2023-12-24 DIAGNOSIS — I5032 Chronic diastolic (congestive) heart failure: Secondary | ICD-10-CM | POA: Insufficient documentation

## 2023-12-24 DIAGNOSIS — I110 Hypertensive heart disease with heart failure: Secondary | ICD-10-CM | POA: Diagnosis present

## 2023-12-24 DIAGNOSIS — E669 Obesity, unspecified: Secondary | ICD-10-CM | POA: Diagnosis not present

## 2023-12-24 DIAGNOSIS — R7989 Other specified abnormal findings of blood chemistry: Secondary | ICD-10-CM | POA: Diagnosis not present

## 2023-12-24 DIAGNOSIS — Z87891 Personal history of nicotine dependence: Secondary | ICD-10-CM | POA: Diagnosis not present

## 2023-12-24 DIAGNOSIS — I493 Ventricular premature depolarization: Secondary | ICD-10-CM | POA: Diagnosis not present

## 2023-12-24 DIAGNOSIS — R000 Tachycardia, unspecified: Secondary | ICD-10-CM | POA: Diagnosis not present

## 2023-12-24 DIAGNOSIS — L299 Pruritus, unspecified: Secondary | ICD-10-CM | POA: Diagnosis not present

## 2023-12-24 DIAGNOSIS — I440 Atrioventricular block, first degree: Secondary | ICD-10-CM | POA: Insufficient documentation

## 2023-12-24 DIAGNOSIS — I4892 Unspecified atrial flutter: Secondary | ICD-10-CM | POA: Diagnosis not present

## 2023-12-24 DIAGNOSIS — Z79899 Other long term (current) drug therapy: Secondary | ICD-10-CM | POA: Insufficient documentation

## 2023-12-24 DIAGNOSIS — M109 Gout, unspecified: Secondary | ICD-10-CM | POA: Insufficient documentation

## 2023-12-24 DIAGNOSIS — I10 Essential (primary) hypertension: Secondary | ICD-10-CM | POA: Diagnosis not present

## 2023-12-24 MED ORDER — POTASSIUM CHLORIDE CRYS ER 10 MEQ PO TBCR: 20.0000 meq | EXTENDED_RELEASE_TABLET | Freq: Every day | ORAL | 3 refills | Status: AC | PRN

## 2023-12-24 NOTE — Patient Instructions (Signed)
 Medication Changes:  CHANGED Potassium tabs to the 10meq tabs. Please take 20meq (2 tabs) daily as needed when you take Furosemide  (Lasix )  Lab Work:   Go downstairs to [facility] on [address] to have your blood work completed.  We will only call you if the results are abnormal or if the provider would like to make medication changes.  No news is good news.   Follow-Up in: Please follow up with the [facility] in December with Ellouise Class, FNP.   Thank you for choosing [facility].    At the [facility], you and your health needs are our priority. We have a designated team specialized in the treatment of Heart Failure. This Care Team includes your primary Heart Failure Specialized Cardiologist (physician), Advanced Practice Providers (APPs- Physician Assistants and Nurse Practitioners), and Pharmacist who all work together to provide you with the care you need, when you need it.   You may see any of the following providers on your designated Care Team at your next follow up:  Dr. Toribio Fuel Dr. Ezra Shuck Dr. Ria Commander Dr. Morene Brownie Ellouise Class, FNP Jaun Bash, RPH-CPP  Please be sure to bring in all your medications bottles to every appointment.   Need to Contact Us :  If you have any questions or concerns before your next appointment please send us  a message through [place] or call our office at [phone].    TO LEAVE A MESSAGE FOR THE NURSE SELECT OPTION 2, PLEASE LEAVE A MESSAGE INCLUDING: YOUR NAME DATE OF BIRTH CALL BACK NUMBER REASON FOR CALL**this is important as we prioritize the call backs  YOU WILL RECEIVE A CALL BACK THE SAME DAY AS LONG AS YOU CALL BEFORE 4:00 PM

## 2023-12-25 ENCOUNTER — Ambulatory Visit: Payer: Self-pay | Admitting: Family

## 2023-12-25 LAB — BASIC METABOLIC PANEL WITH GFR
BUN/Creatinine Ratio: 15 (ref 12–28)
BUN: 17 mg/dL (ref 8–27)
CO2: 27 mmol/L (ref 20–29)
Calcium: 10 mg/dL (ref 8.7–10.3)
Chloride: 98 mmol/L (ref 96–106)
Creatinine, Ser: 1.15 mg/dL — ABNORMAL HIGH (ref 0.57–1.00)
Glucose: 87 mg/dL (ref 70–99)
Potassium: 4.3 mmol/L (ref 3.5–5.2)
Sodium: 140 mmol/L (ref 134–144)
eGFR: 48 mL/min/1.73 — ABNORMAL LOW (ref >59)

## 2024-01-08 ENCOUNTER — Ambulatory Visit (INDEPENDENT_AMBULATORY_CARE_PROVIDER_SITE_OTHER)

## 2024-01-08 ENCOUNTER — Ambulatory Visit: Attending: Internal Medicine

## 2024-01-08 DIAGNOSIS — I5032 Chronic diastolic (congestive) heart failure: Secondary | ICD-10-CM

## 2024-01-08 LAB — ECHOCARDIOGRAM COMPLETE
AR max vel: 1.66 cm2
AV Area VTI: 1.68 cm2
AV Area mean vel: 1.67 cm2
AV Mean grad: 13.5 mmHg
AV Peak grad: 23.6 mmHg
Ao pk vel: 2.43 m/s
Area-P 1/2: 3.08 cm2
MV M vel: 4.48 m/s
MV Peak grad: 80.3 mmHg
MV VTI: 1.53 cm2
P 1/2 time: 386 ms
S' Lateral: 3 cm

## 2024-02-13 ENCOUNTER — Ambulatory Visit: Admitting: Family

## 2024-03-08 ENCOUNTER — Telehealth: Payer: Self-pay | Admitting: Family

## 2024-03-08 NOTE — Progress Notes (Unsigned)
 "  ADVANCED HF CLINIC NOTE  Referring Physician: [facility] ED Primary Care: Odell Chard, Edra GRADE, MD Primary Cardiologist: None HF Provider: Dr Cherrie  Chief Complaint: shortness of breath   HPI: [name] is an 84 y/o woman with h/o HTN, obesity, HFpEF, and gout.   Seen in ED on 07/23/23 with several day h/o SOB. Initially seen by PCP and sent to ED for possible heart block. ECG read as sinus tach with marked 1AVB, hstrop and CXR ok. BNP elevated at 356. Given a dose of IV lasix .  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 No A fib/Flutter occasional PVCs.   Genetic Test- Negative   US  10/24/23 to rule out DVT due to lower leg edema. US  negative.   Echo 01/08/24: EF 60-65%, G1DD, normal RV, mild MR  She presents today, accompanied by her husband, for a follow up heart failure visit with a chief complaint of shortness of breath if she eats too much salt. Has associated minimal fatigue, pedal edema, cough when fluid up. She says that when she limits her sodium intake, her shortness of breath and edema are much better. Currently has a rash under her breast that is being treated and improving, per her report. Denies chest pain, palpitations, abdominal distention or difficulty sleeping.   Took lasix  last night due to feeling fluid up with edema. Last week took it three times. Week prior to that, she didn't take it at all. When she does take lasix , she does take 20meq potassium along with it.   Tries to cook mostly at home. Did have a conference this past weekend so was unable to take diuretic as well as her feet were down the majority of the day. Denies smoking, alcohol, and drug use.   ROS: All systems negative except what is listed in HPI, PMH and Problem List   Mother died of HF at 69 Maternal nephew with HF in 63s   Past Medical History:  Diagnosis Date   Gout    Hypertension     Current Outpatient Medications  Medication Sig Dispense  Refill   allopurinol (ZYLOPRIM) 100 MG tablet Take 100 mg by mouth daily.     Ascorbic Acid (VITAMIN C) 1000 MG tablet Take 1,000 mg by mouth daily.     cholecalciferol (VITAMIN D) 25 MCG (1000 UNIT) tablet Take 1,000 Units by mouth daily.     diphenhydrAMINE (BENADRYL) 25 mg capsule Take 25 mg by mouth every 6 (six) hours as needed.     furosemide  (LASIX ) 20 MG tablet Take 1 tablet (20 mg total) by mouth daily as needed. For weight gain or swelling 30 tablet 11   JARDIANCE  10 MG TABS tablet Take 1 tablet (10 mg total) by mouth daily before breakfast.     potassium chloride  (KLOR-CON  M) 10 MEQ tablet Take 2 tablets (20 mEq total) by mouth daily as needed. With furosemide  60 tablet 3   Red Yeast Rice 600 MG CAPS Take by mouth.     triamterene-hydrochlorothiazide (MAXZIDE-25) 37.5-25 MG tablet Take 1 tablet by mouth daily.     Turmeric 500 MG CAPS Take by mouth.     No current facility-administered medications for this visit.    Allergies  Allergen Reactions   Penicillins    Shellfish Allergy Itching      Social History   Socioeconomic History   Marital status: Married    Spouse name: Not on file   Number  of children: Not on file   Years of education: Not on file   Highest education level: Not on file  Occupational History   Not on file  Tobacco Use   Smoking status: Former   Smokeless tobacco: Never  Vaping Use   Vaping status: Never Used  Substance and Sexual Activity   Alcohol use: Yes    Comment: ocassionaly    Drug use: No   Sexual activity: Not on file  Other Topics Concern   Not on file  Social History Narrative   Not on file   Social Drivers of Health   Tobacco Use: Medium Risk (12/24/2023)   Patient History    Smoking Tobacco Use: Former    Smokeless Tobacco Use: Never    Passive Exposure: Not on [place] Strain: Not on file  Food Insecurity: Not on file  Transportation Needs: Not on file  Physical Activity: Not on file  Stress: Not on  file  Social Connections: Not on file  Intimate Partner Violence: Not on file  Depression (PHQ2-9): Not on file  Alcohol Screen: Not on file  Housing: Not on file  Utilities: Not on file  Health Literacy: Not on file      Family History  Problem Relation Age of Onset   Breast cancer Neg Hx    Vitals:   03/09/24 1139  BP: 117/70  Pulse: 68  SpO2: 97%  Weight: 221 lb 9.6 oz (100.5 kg)   Wt Readings from Last 3 Encounters:  03/09/24 221 lb 9.6 oz (100.5 kg)  12/24/23 219 lb (99.3 kg)  10/22/23 222 lb 12.8 oz (101.1 kg)   Lab Results  Component Value Date   CREATININE 1.15 (H) 12/24/2023   CREATININE 0.90 08/08/2023   CREATININE 1.12 (H) 07/23/2023   PHYSICAL EXAM:  General: Well appearing.  Cor: No JVD. Regular rhythm, rate. RUSB 2/6 Lungs: clear Abdomen: soft, nontender, nondistended. Extremities:  trace pitting edema bilateral lower legs (took lasix  / potassium last night) Neuro:. Affect pleasant   ASSESSMENT & PLAN:  1. Chronic HFpEF - POCUS echo 07/31/23 EF 60-65% (likely mild LVH). AoV thickened. Mod-severe MAC. Echo 01/08/24: EF 60-65%, G1DD, normal RV, mild MR - NYHA II - euvolemic  - weight up 2 pounds since last visit 2 months ago. She took lasix  / potassium last night  - encouraged sodium restriction <2g daily  - TTR Genetic test negative. Previously discussed.  - continue compression stockings daily, remove at night  - continue jardiance  10mg  daily, pt advised to report UTI symptoms. Currently has a rash under her breast but she says that it's improving with treatment. Does not want to stop jardiance  as she feels better with taking it  - continue lasix  20 mg as needed with continue potassium 10meq. She will let us  know if she starts taking it more often - continue maxzide daily   - discussed adding MRA but she declines today - BMET 12/24/23 reviewed: sodium 140, potassium 4.3, creatinine 1.15, and GFR 48  2. Marked 1AVB - ZIO XT- 1st degree heart  block. No A Fib. Average heart rate 85. Occasinal PVCs - discussed likely need for PPM down the road  3. HTN - BP controlled 117/70 - continue maxzide daily     Return in 3 months to see Dr Cherrie, sooner if needed.   I spent 35 minutes reviewing records, interviewing/ examing patient and managing plan/ orders.   Ellouise DELENA Class FNP-C 03/09/2024   "

## 2024-03-08 NOTE — Telephone Encounter (Signed)
 Called to confirm/remind patient of their appointment at the [facility] on 03/09/24.   Appointment:   [] Confirmed  [x] Left mess   [] No answer/No voice mail  [] VM Full/unable to leave message  [] Phone not in service  Patient reminded to bring all medications and/or complete list.  Confirmed patient has transportation. Gave directions, instructed to utilize valet parking.

## 2024-03-09 ENCOUNTER — Ambulatory Visit: Attending: Family | Admitting: Family

## 2024-03-09 ENCOUNTER — Encounter: Payer: Self-pay | Admitting: Family

## 2024-03-09 VITALS — BP 117/70 | HR 68 | Wt 221.6 lb

## 2024-03-09 DIAGNOSIS — I5032 Chronic diastolic (congestive) heart failure: Secondary | ICD-10-CM | POA: Diagnosis not present

## 2024-03-09 DIAGNOSIS — M109 Gout, unspecified: Secondary | ICD-10-CM | POA: Diagnosis not present

## 2024-03-09 DIAGNOSIS — R21 Rash and other nonspecific skin eruption: Secondary | ICD-10-CM | POA: Diagnosis not present

## 2024-03-09 DIAGNOSIS — Z79899 Other long term (current) drug therapy: Secondary | ICD-10-CM | POA: Insufficient documentation

## 2024-03-09 DIAGNOSIS — I110 Hypertensive heart disease with heart failure: Secondary | ICD-10-CM | POA: Insufficient documentation

## 2024-03-09 DIAGNOSIS — Z7984 Long term (current) use of oral hypoglycemic drugs: Secondary | ICD-10-CM | POA: Diagnosis not present

## 2024-03-09 DIAGNOSIS — E669 Obesity, unspecified: Secondary | ICD-10-CM | POA: Diagnosis not present

## 2024-03-09 DIAGNOSIS — I440 Atrioventricular block, first degree: Secondary | ICD-10-CM | POA: Diagnosis not present

## 2024-03-09 DIAGNOSIS — I10 Essential (primary) hypertension: Secondary | ICD-10-CM

## 2024-03-09 DIAGNOSIS — I493 Ventricular premature depolarization: Secondary | ICD-10-CM | POA: Diagnosis not present

## 2024-03-09 DIAGNOSIS — Z8679 Personal history of other diseases of the circulatory system: Secondary | ICD-10-CM | POA: Insufficient documentation

## 2024-03-09 DIAGNOSIS — Z87891 Personal history of nicotine dependence: Secondary | ICD-10-CM | POA: Diagnosis not present

## 2024-03-09 NOTE — Patient Instructions (Signed)
 It was good to see you today!

## 2024-03-20 ENCOUNTER — Other Ambulatory Visit
Admission: RE | Admit: 2024-03-20 | Discharge: 2024-03-20 | Disposition: A | Source: Ambulatory Visit | Attending: Student | Admitting: Student

## 2024-03-20 DIAGNOSIS — R81 Glycosuria: Secondary | ICD-10-CM | POA: Diagnosis present

## 2024-03-20 LAB — COMPREHENSIVE METABOLIC PANEL WITH GFR
ALT: 10 U/L (ref 0–44)
AST: 22 U/L (ref 15–41)
Albumin: 3.9 g/dL (ref 3.5–5.0)
Alkaline Phosphatase: 90 U/L (ref 38–126)
Anion gap: 9 (ref 5–15)
BUN: 14 mg/dL (ref 8–23)
CO2: 30 mmol/L (ref 22–32)
Calcium: 9.8 mg/dL (ref 8.9–10.3)
Chloride: 101 mmol/L (ref 98–111)
Creatinine, Ser: 0.94 mg/dL (ref 0.44–1.00)
GFR, Estimated: >60 mL/min
Glucose, Bld: 89 mg/dL (ref 70–99)
Potassium: 3.7 mmol/L (ref 3.5–5.1)
Sodium: 140 mmol/L (ref 135–145)
Total Bilirubin: 0.6 mg/dL (ref 0.0–1.2)
Total Protein: 7.4 g/dL (ref 6.5–8.1)
